# Patient Record
Sex: Male | Born: 1980 | Race: Black or African American | Hispanic: No | Marital: Married | Smoking: Never smoker
Health system: Southern US, Community
[De-identification: ages and names within clinical notes are randomized; demographics above are authoritative.]

## PROBLEM LIST (undated history)

## (undated) DIAGNOSIS — I10 Essential (primary) hypertension: Secondary | ICD-10-CM

---

## 2018-03-16 ENCOUNTER — Other Ambulatory Visit: Payer: Self-pay | Admitting: Gastroenterology

## 2018-03-16 DIAGNOSIS — R1033 Periumbilical pain: Secondary | ICD-10-CM

## 2018-03-17 ENCOUNTER — Ambulatory Visit
Admission: RE | Admit: 2018-03-17 | Discharge: 2018-03-17 | Disposition: A | Discharge status: Home / Self Care | Payer: Self-pay | Source: Ambulatory Visit | Attending: Gastroenterology | Admitting: Gastroenterology

## 2018-03-17 DIAGNOSIS — R1033 Periumbilical pain: Secondary | ICD-10-CM

## 2018-03-17 MED ORDER — IOPAMIDOL (ISOVUE-300) INJECTION 61%
100.0000 mL | Freq: Once | INTRAVENOUS | Status: AC | PRN
  Administered 2018-03-17: 100 mL via INTRAVENOUS

## 2018-07-19 ENCOUNTER — Encounter: Payer: Self-pay | Admitting: Sports Medicine

## 2018-07-19 ENCOUNTER — Ambulatory Visit (INDEPENDENT_AMBULATORY_CARE_PROVIDER_SITE_OTHER): Payer: 59 | Admitting: Sports Medicine

## 2018-07-19 ENCOUNTER — Other Ambulatory Visit: Payer: Self-pay

## 2018-07-19 VITALS — BP 120/90 | Ht 67.0 in | Wt 180.0 lb

## 2018-07-19 DIAGNOSIS — S63259A Unspecified dislocation of unspecified finger, initial encounter: Secondary | ICD-10-CM | POA: Diagnosis not present

## 2018-07-19 NOTE — Progress Notes (Addendum)
   Subjective:    Patient ID: Timothy Mcgrath, male    DOB: 1980/12/23, 38 y.o.   MRN: [id number]  HPI chief complaint: Right ring finger pain  Very pleasant right-hand-dominant 38 year old male comes in today after having injured his right ring finger yesterday around 5:00 PM.  While opening a drawer he awkwardly caught his ring finger in the drawer below.  Immediate pain and he noticed some deformity at the PIP joint.  He tried to "pop it back into place" but he was unsuccessful.  He has applied ice overnight and used a topical ointment.  Localizes his pain to the proximal phalanx and PIP joint.  Denies pain or swelling in his hand.  Denies any previous hand or wrist surgeries.  Past medical history reviewed Medications reviewed Allergies reviewed   Review of Systems As above    Objective:   Physical Exam  Well-developed, well-nourished.  No acute distress.  Awake alert and oriented x3.  Vital signs reviewed  Right hand: There is diffuse swelling throughout the entire right ring finger.  Swelling extends up into the dorsum of the right hand.  Although swelling makes obvious gross deformity hard to visualize, palpation does suggest dislocation at the PIP joint.  There is no malrotation.  Patient is unable to move the PIP joint.  Flexor and extensor tendons appear to be intact.  Brisk capillary refill distally.  Bedside ultrasound shows no obvious fracture but there does appear to be a dorsal PIP dislocation of the right ring finger.      Assessment & Plan:   Right ring finger pain and swelling with ultrasound evidence of probable dorsal PIP dislocation  Since this injury is nearly 24 hours old, it may need to be surgically reduced.  A call was placed to Dr. Cline Cools office and they have kindly agreed to see the patient in the office today.  Patient was instructed to proceed directly to their office.  Further work-up and treatment will be per the discretion of Dr. Merlyn Lot and the patient  will follow-up with me as needed.   Addendum: Please note that the injury is actually to this patient's right middle finger and not the ring finger as stated in the above note.  Addendum: Please note that the injury is actually to this patient's middle finger, not ring finger as stated in the above note.

## 2018-07-19 NOTE — Patient Instructions (Addendum)
Dr Cindee Salt [address] [place], [place] [address] Phone: [phone]

## 2020-01-14 ENCOUNTER — Ambulatory Visit
Admission: RE | Admit: 2020-01-14 | Discharge: 2020-01-14 | Disposition: A | Discharge status: Home / Self Care | Payer: Self-pay | Source: Ambulatory Visit | Attending: Family Medicine | Admitting: Family Medicine

## 2020-01-14 ENCOUNTER — Other Ambulatory Visit: Payer: Self-pay | Admitting: Family Medicine

## 2020-01-14 ENCOUNTER — Other Ambulatory Visit: Payer: Self-pay

## 2020-01-14 DIAGNOSIS — R079 Chest pain, unspecified: Secondary | ICD-10-CM

## 2020-02-08 ENCOUNTER — Other Ambulatory Visit: Payer: Self-pay | Admitting: Family Medicine

## 2020-02-08 ENCOUNTER — Ambulatory Visit
Admission: RE | Admit: 2020-02-08 | Discharge: 2020-02-08 | Disposition: A | Discharge status: Home / Self Care | Payer: No Typology Code available for payment source | Source: Ambulatory Visit | Attending: Family Medicine | Admitting: Family Medicine

## 2020-02-08 DIAGNOSIS — J189 Pneumonia, unspecified organism: Secondary | ICD-10-CM

## 2021-05-06 ENCOUNTER — Other Ambulatory Visit: Payer: Self-pay | Admitting: Family Medicine

## 2021-05-06 ENCOUNTER — Ambulatory Visit
Admission: RE | Admit: 2021-05-06 | Discharge: 2021-05-06 | Disposition: A | Discharge status: Home / Self Care | Payer: Self-pay | Source: Ambulatory Visit | Attending: Family Medicine | Admitting: Family Medicine

## 2021-05-06 ENCOUNTER — Other Ambulatory Visit: Payer: Self-pay

## 2021-05-06 DIAGNOSIS — R0600 Dyspnea, unspecified: Secondary | ICD-10-CM

## 2021-11-17 ENCOUNTER — Other Ambulatory Visit (HOSPITAL_COMMUNITY): Payer: Self-pay | Admitting: Family Medicine

## 2021-11-17 ENCOUNTER — Other Ambulatory Visit (HOSPITAL_BASED_OUTPATIENT_CLINIC_OR_DEPARTMENT_OTHER): Payer: Self-pay | Admitting: Family Medicine

## 2021-11-17 DIAGNOSIS — E785 Hyperlipidemia, unspecified: Secondary | ICD-10-CM

## 2021-12-03 ENCOUNTER — Ambulatory Visit (HOSPITAL_BASED_OUTPATIENT_CLINIC_OR_DEPARTMENT_OTHER)
Admission: RE | Admit: 2021-12-03 | Discharge: 2021-12-03 | Disposition: A | Discharge status: Home / Self Care | Payer: 59 | Source: Ambulatory Visit | Attending: Family Medicine | Admitting: Family Medicine

## 2021-12-03 DIAGNOSIS — E785 Hyperlipidemia, unspecified: Secondary | ICD-10-CM | POA: Insufficient documentation

## 2022-10-13 ENCOUNTER — Ambulatory Visit
Admission: RE | Admit: 2022-10-13 | Discharge: 2022-10-13 | Disposition: A | Discharge status: Home / Self Care | Payer: 59 | Source: Ambulatory Visit | Attending: Family Medicine | Admitting: Family Medicine

## 2022-10-13 ENCOUNTER — Other Ambulatory Visit: Payer: Self-pay | Admitting: Family Medicine

## 2022-10-13 DIAGNOSIS — R0781 Pleurodynia: Secondary | ICD-10-CM

## 2022-10-14 ENCOUNTER — Ambulatory Visit (HOSPITAL_BASED_OUTPATIENT_CLINIC_OR_DEPARTMENT_OTHER): Admission: RE | Admit: 2022-10-14 | Payer: 59 | Source: Ambulatory Visit

## 2022-10-14 ENCOUNTER — Other Ambulatory Visit (HOSPITAL_COMMUNITY): Payer: Self-pay | Admitting: Family Medicine

## 2022-10-14 DIAGNOSIS — J984 Other disorders of lung: Secondary | ICD-10-CM

## 2022-10-15 ENCOUNTER — Encounter (HOSPITAL_BASED_OUTPATIENT_CLINIC_OR_DEPARTMENT_OTHER): Payer: Self-pay

## 2022-10-16 ENCOUNTER — Ambulatory Visit (HOSPITAL_BASED_OUTPATIENT_CLINIC_OR_DEPARTMENT_OTHER)
Admission: RE | Admit: 2022-10-16 | Discharge: 2022-10-16 | Disposition: A | Discharge status: Home / Self Care | Payer: 59 | Source: Ambulatory Visit | Attending: Family Medicine | Admitting: Family Medicine

## 2022-10-16 ENCOUNTER — Encounter (HOSPITAL_BASED_OUTPATIENT_CLINIC_OR_DEPARTMENT_OTHER): Payer: Self-pay

## 2022-10-16 DIAGNOSIS — J984 Other disorders of lung: Secondary | ICD-10-CM | POA: Insufficient documentation

## 2022-10-16 MED ORDER — IOHEXOL 300 MG/ML  SOLN
80.0000 mL | Freq: Once | INTRAMUSCULAR | Status: AC | PRN
  Administered 2022-10-16: 80 mL via INTRAVENOUS

## 2022-10-18 ENCOUNTER — Other Ambulatory Visit (HOSPITAL_COMMUNITY): Payer: Self-pay | Admitting: Family Medicine

## 2022-10-18 DIAGNOSIS — J9859 Other diseases of mediastinum, not elsewhere classified: Secondary | ICD-10-CM

## 2022-10-18 DIAGNOSIS — R918 Other nonspecific abnormal finding of lung field: Secondary | ICD-10-CM

## 2022-10-22 ENCOUNTER — Encounter: Payer: Self-pay | Admitting: Family Medicine

## 2022-10-22 ENCOUNTER — Other Ambulatory Visit (HOSPITAL_COMMUNITY): Payer: Self-pay | Admitting: Family Medicine

## 2022-10-22 ENCOUNTER — Ambulatory Visit (HOSPITAL_COMMUNITY)
Admission: RE | Admit: 2022-10-22 | Discharge: 2022-10-22 | Disposition: A | Discharge status: Home / Self Care | Payer: 59 | Source: Ambulatory Visit | Attending: Family Medicine | Admitting: Family Medicine

## 2022-10-22 DIAGNOSIS — R918 Other nonspecific abnormal finding of lung field: Secondary | ICD-10-CM | POA: Diagnosis not present

## 2022-10-22 DIAGNOSIS — E041 Nontoxic single thyroid nodule: Secondary | ICD-10-CM

## 2022-10-22 DIAGNOSIS — J9859 Other diseases of mediastinum, not elsewhere classified: Secondary | ICD-10-CM | POA: Insufficient documentation

## 2022-10-22 LAB — GLUCOSE, CAPILLARY: Glucose-Capillary: 124 mg/dL — ABNORMAL HIGH (ref 70–99)

## 2022-10-22 MED ORDER — FLUDEOXYGLUCOSE F - 18 (FDG) INJECTION
9.0000 | Freq: Once | INTRAVENOUS | Status: AC
  Administered 2022-10-22: 8.44 via INTRAVENOUS

## 2022-10-24 ENCOUNTER — Ambulatory Visit (HOSPITAL_BASED_OUTPATIENT_CLINIC_OR_DEPARTMENT_OTHER)
Admission: RE | Admit: 2022-10-24 | Discharge: 2022-10-24 | Disposition: A | Discharge status: Home / Self Care | Payer: 59 | Source: Ambulatory Visit | Attending: Family Medicine | Admitting: Family Medicine

## 2022-10-24 DIAGNOSIS — E041 Nontoxic single thyroid nodule: Secondary | ICD-10-CM | POA: Diagnosis present

## 2022-10-26 ENCOUNTER — Other Ambulatory Visit (HOSPITAL_COMMUNITY): Payer: Self-pay | Admitting: Family Medicine

## 2022-10-26 DIAGNOSIS — E041 Nontoxic single thyroid nodule: Secondary | ICD-10-CM

## 2022-11-01 ENCOUNTER — Encounter: Payer: Self-pay | Admitting: Pulmonary Disease

## 2022-11-01 ENCOUNTER — Ambulatory Visit (INDEPENDENT_AMBULATORY_CARE_PROVIDER_SITE_OTHER): Payer: 59 | Admitting: Pulmonary Disease

## 2022-11-01 VITALS — BP 130/90 | HR 87 | Ht 66.0 in | Wt 173.6 lb

## 2022-11-01 DIAGNOSIS — R918 Other nonspecific abnormal finding of lung field: Secondary | ICD-10-CM | POA: Diagnosis not present

## 2022-11-01 DIAGNOSIS — R942 Abnormal results of pulmonary function studies: Secondary | ICD-10-CM | POA: Diagnosis not present

## 2022-11-01 DIAGNOSIS — J90 Pleural effusion, not elsewhere classified: Secondary | ICD-10-CM | POA: Insufficient documentation

## 2022-11-01 NOTE — Patient Instructions (Signed)
Thank you for visiting Dr. Tonia Brooms at [facility]. Today we recommend the following:  Return if symptoms worsen or fail to improve, for wednesday 11th for [facility] .    Please do your part to reduce the spread of COVID-19.

## 2022-11-01 NOTE — Progress Notes (Signed)
Synopsis: Referred in September 2024 for lung mass by Farris Has, MD  Subjective:   PATIENT ID: Timothy Mcgrath GENDER: male DOB: 1980/04/23, MRN: [id number]  Chief Complaint  Patient presents with   Consult    Consult for lung nodule/mass    This is a 42 year old gentleman, no significant past medical history.Patient was referred after having abnormal CT imaging of the chest in August 2024 as well as abnormal nuclear medicine pet imaging.  Patient had a PET scan that was completed on October 22, 2022 which reveals a 3.0 x 2.7 cm left lower lobe mass it is hypermetabolic concerning for primary bronchogenic carcinoma.  Also there was a tiny focus of uptake within the patient's left hilum.  No other evidence of metastatic disease.  He has a 13 mm left thyroid nodule as well.  Patient had a CT scan of the chest to with ongoing fever and cough.  He was diagnosed with pneumonia treated with antibiotics.  He is feeling better.  Follow-up nuclear medicine pet imaging demonstrated a worsening left-sided pleural effusion and the mass in the chest is change not significantly changed.    No past medical history on file.   No family history on file.   No past surgical history on file.  Social History   Socioeconomic History   Marital status: Married    Spouse name: Not on file   Number of children: Not on file   Years of education: Not on file   Highest education level: Not on file  Occupational History   Not on file  Tobacco Use   Smoking status: Never   Smokeless tobacco: Never  Substance and Sexual Activity   Alcohol use: Not on file   Drug use: Not on file   Sexual activity: Not on file  Other Topics Concern   Not on file  Social History Narrative   Not on file   Social Determinants of Health   Financial Resource Strain: Not on file  Food Insecurity: Not on file  Transportation Needs: Not on file  Physical Activity: Not on file  Stress: Not on file  Social Connections:  Not on file  Intimate Partner Violence: Not on file     Allergies  Allergen Reactions   Iodinated Contrast Media Nausea And Vomiting     Outpatient Medications Prior to Visit  Medication Sig Dispense Refill   amLODipine (NORVASC) 10 MG tablet Take 10 mg by mouth daily.     lisinopril (ZESTRIL) 20 MG tablet Take 20 mg by mouth daily.     metoprolol succinate (TOPROL-XL) 25 MG 24 hr tablet Take 25 mg by mouth daily.     KAPSPARGO SPRINKLE 25 MG CS24 Take 1 tablet by mouth daily.     No facility-administered medications prior to visit.    Review of Systems  Constitutional:  Negative for chills, fever, malaise/fatigue and weight loss.  HENT:  Negative for hearing loss, sore throat and tinnitus.   Eyes:  Negative for blurred vision and double vision.  Respiratory:  Positive for cough and shortness of breath. Negative for hemoptysis, sputum production, wheezing and stridor.        Inspiratory pleuritic chest pains  Cardiovascular:  Negative for chest pain, palpitations, orthopnea, leg swelling and PND.  Gastrointestinal:  Negative for abdominal pain, constipation, diarrhea, heartburn, nausea and vomiting.  Genitourinary:  Negative for dysuria, hematuria and urgency.  Musculoskeletal:  Negative for joint pain and myalgias.  Skin:  Negative for itching and rash.  Neurological:  Negative for dizziness, tingling, weakness and headaches.  Endo/Heme/Allergies:  Negative for environmental allergies. Does not bruise/bleed easily.  Psychiatric/Behavioral:  Negative for depression. The patient is not nervous/anxious and does not have insomnia.   All other systems reviewed and are negative.    Objective:  Physical Exam Vitals reviewed.  Constitutional:      General: He is not in acute distress.    Appearance: He is well-developed.  HENT:     Head: Normocephalic and atraumatic.  Eyes:     General: No scleral icterus.    Conjunctiva/sclera: Conjunctivae normal.     Pupils: Pupils are  equal, round, and reactive to light.  Neck:     Vascular: No JVD.     Trachea: No tracheal deviation.  Cardiovascular:     Rate and Rhythm: Normal rate and regular rhythm.     Heart sounds: Normal heart sounds. No murmur heard. Pulmonary:     Effort: Pulmonary effort is normal. No tachypnea, accessory muscle usage or respiratory distress.     Breath sounds: No stridor. No wheezing, rhonchi or rales.  Abdominal:     General: There is no distension.     Palpations: Abdomen is soft.     Tenderness: There is no abdominal tenderness.  Musculoskeletal:        General: No tenderness.     Cervical back: Neck supple.  Lymphadenopathy:     Cervical: No cervical adenopathy.  Skin:    General: Skin is warm and dry.     Capillary Refill: Capillary refill takes less than 2 seconds.     Findings: No rash.  Neurological:     Mental Status: He is alert and oriented to person, place, and time.  Psychiatric:        Behavior: Behavior normal.      Vitals:   11/01/22 1512  BP: (!) 130/90  Pulse: 87  SpO2: 97%  Weight: 173 lb 9.6 oz (78.7 kg)  Height: 5\' 6"  (1.676 m)   97% on RA BMI Readings from Last 3 Encounters:  11/01/22 28.02 kg/m  10/22/22 26.63 kg/m  07/19/18 28.19 kg/m   Wt Readings from Last 3 Encounters:  11/01/22 173 lb 9.6 oz (78.7 kg)  10/22/22 170 lb (77.1 kg)  07/19/18 180 lb (81.6 kg)     CBC No results found for: "WBC", "RBC", "HGB", "HCT", "PLT", "MCV", "MCH", "MCHC", "RDW", "LYMPHSABS", "MONOABS", "EOSABS", "BASOSABS"    Chest Imaging: Nuclear medicine pet imaging August 2024: Left lower lobe effusion progressed since last CT, hypermetabolic lesion. To me appears like this is pneumonia with a progressive effusion. The patient's images have been independently reviewed by me.    Pulmonary Functions Testing Results:     No data to display          FeNO:   Pathology:   Echocardiogram:   Heart Catheterization:     Assessment & Plan:      ICD-10-CM   1. Mass of lower lobe of left lung  R91.8 Procedural/ Surgical Case Request: INSERTION PLEURAL DRAINAGE CATHETER    Ambulatory referral to Pulmonology    2. Abnormal PET of left lung  R94.2 Procedural/ Surgical Case Request: INSERTION PLEURAL DRAINAGE CATHETER    Ambulatory referral to Pulmonology    3. Pleural effusion  J90 Procedural/ Surgical Case Request: INSERTION PLEURAL DRAINAGE CATHETER    Ambulatory referral to Pulmonology      Discussion:  This is a 42 year old gentleman now with a new effusion in the  left.  Ultrasound today in the office to confirm this.  He presented initially with fevers and cough.  I suspect what were dealing with his pneumonia in this gentleman setting.  He needs to have this drained.  We talked about thoracentesis versus pigtail catheter placement.  There is a little bit of fibrin deposition in his ultrasound today and I think is probably best that he has complete drainage with a pleural catheter.  We talked about having this done today and he is not willing to have this done yet.  Plan: Recommend pigtail catheter placement Tentative date he states he can do Wednesday at that afternoon. Working him set up with one of my partners at [facility] endoscopy. I suspect the mass that he has in his chest is actually pneumonia but we need to follow this closely and also sent for cytology on the pleural fluid.  If lesion stays persistent of course would potentially need bronchoscopy and biopsy in the future.     Current Outpatient Medications:    amLODipine (NORVASC) 10 MG tablet, Take 10 mg by mouth daily., Disp: , Rfl:    lisinopril (ZESTRIL) 20 MG tablet, Take 20 mg by mouth daily., Disp: , Rfl:    metoprolol succinate (TOPROL-XL) 25 MG 24 hr tablet, Take 25 mg by mouth daily., Disp: , Rfl:    Josephine Igo, DO [facility] 11/01/2022 3:43 PM

## 2022-11-02 ENCOUNTER — Encounter (HOSPITAL_COMMUNITY): Payer: Self-pay | Admitting: Certified Registered Nurse Anesthetist

## 2022-11-03 ENCOUNTER — Inpatient Hospital Stay (HOSPITAL_COMMUNITY)
Admission: RE | Admit: 2022-11-03 | Discharge: 2022-11-07 | DRG: 187 | Disposition: A | Discharge status: Home / Self Care | Payer: 59 | Attending: Pulmonary Disease | Admitting: Pulmonary Disease

## 2022-11-03 ENCOUNTER — Inpatient Hospital Stay (HOSPITAL_COMMUNITY): Payer: 59

## 2022-11-03 ENCOUNTER — Encounter (HOSPITAL_COMMUNITY): Payer: Self-pay | Admitting: Pulmonary Disease

## 2022-11-03 ENCOUNTER — Encounter (HOSPITAL_COMMUNITY)
Admission: RE | Disposition: A | Discharge status: Home / Self Care | Payer: Self-pay | Source: Home / Self Care | Attending: Pulmonary Disease

## 2022-11-03 ENCOUNTER — Other Ambulatory Visit: Payer: Self-pay

## 2022-11-03 DIAGNOSIS — I10 Essential (primary) hypertension: Secondary | ICD-10-CM | POA: Diagnosis present

## 2022-11-03 DIAGNOSIS — E222 Syndrome of inappropriate secretion of antidiuretic hormone: Secondary | ICD-10-CM | POA: Diagnosis present

## 2022-11-03 DIAGNOSIS — J939 Pneumothorax, unspecified: Secondary | ICD-10-CM | POA: Diagnosis not present

## 2022-11-03 DIAGNOSIS — Z91041 Radiographic dye allergy status: Secondary | ICD-10-CM | POA: Diagnosis not present

## 2022-11-03 DIAGNOSIS — R918 Other nonspecific abnormal finding of lung field: Secondary | ICD-10-CM | POA: Diagnosis present

## 2022-11-03 DIAGNOSIS — J90 Pleural effusion, not elsewhere classified: Principal | ICD-10-CM | POA: Diagnosis present

## 2022-11-03 DIAGNOSIS — Z79899 Other long term (current) drug therapy: Secondary | ICD-10-CM | POA: Diagnosis not present

## 2022-11-03 DIAGNOSIS — R942 Abnormal results of pulmonary function studies: Secondary | ICD-10-CM | POA: Diagnosis present

## 2022-11-03 DIAGNOSIS — J302 Other seasonal allergic rhinitis: Secondary | ICD-10-CM | POA: Diagnosis present

## 2022-11-03 HISTORY — DX: Essential (primary) hypertension: I10

## 2022-11-03 HISTORY — PX: CHEST TUBE INSERTION: SHX231

## 2022-11-03 LAB — BASIC METABOLIC PANEL
Anion gap: 8 (ref 5–15)
BUN: 19 mg/dL (ref 6–20)
CO2: 28 mmol/L (ref 22–32)
Calcium: 8.8 mg/dL — ABNORMAL LOW (ref 8.9–10.3)
Chloride: 95 mmol/L — ABNORMAL LOW (ref 98–111)
Creatinine, Ser: 0.76 mg/dL (ref 0.61–1.24)
GFR, Estimated: >60 mL/min (ref >60)
Glucose, Bld: 96 mg/dL (ref 70–99)
Potassium: 3.2 mmol/L — ABNORMAL LOW (ref 3.5–5.1)
Sodium: 131 mmol/L — ABNORMAL LOW (ref 135–145)

## 2022-11-03 LAB — PROTEIN, PLEURAL OR PERITONEAL FLUID: Total protein, fluid: 4.8 g/dL

## 2022-11-03 LAB — BODY FLUID CELL COUNT WITH DIFFERENTIAL
Eos, Fluid: 2 %
Lymphs, Fluid: 80 %
Monocyte-Macrophage-Serous Fluid: 10 % — ABNORMAL LOW (ref 50–90)
Neutrophil Count, Fluid: 8 % (ref 0–25)
Total Nucleated Cell Count, Fluid: 4578 uL — ABNORMAL HIGH (ref 0–1000)

## 2022-11-03 LAB — CBC
HCT: 41.6 % (ref 39.0–52.0)
Hemoglobin: 14.4 g/dL (ref 13.0–17.0)
MCH: 29.8 pg (ref 26.0–34.0)
MCHC: 34.6 g/dL (ref 30.0–36.0)
MCV: 86 fL (ref 80.0–100.0)
Platelets: 385 10*3/uL (ref 150–400)
RBC: 4.84 MIL/uL (ref 4.22–5.81)
RDW: 11.5 % (ref 11.5–15.5)
WBC: 6.6 10*3/uL (ref 4.0–10.5)
nRBC: 0 % (ref 0.0–0.2)

## 2022-11-03 LAB — LACTATE DEHYDROGENASE, PLEURAL OR PERITONEAL FLUID: LD, Fluid: 168 U/L — ABNORMAL HIGH (ref 3–23)

## 2022-11-03 LAB — GLUCOSE, PLEURAL OR PERITONEAL FLUID: Glucose, Fluid: 112 mg/dL

## 2022-11-03 LAB — LACTATE DEHYDROGENASE: LDH: 146 U/L (ref 98–192)

## 2022-11-03 LAB — PROTEIN, TOTAL: Total Protein: 7.6 g/dL (ref 6.5–8.1)

## 2022-11-03 SURGERY — CHEST TUBE INSERTION
Anesthesia: LOCAL | Laterality: Left

## 2022-11-03 MED ORDER — OXYCODONE-ACETAMINOPHEN 5-325 MG PO TABS
1.0000 | ORAL_TABLET | ORAL | Status: DC | PRN
  Administered 2022-11-03 – 2022-11-05 (×7): 2 via ORAL
  Administered 2022-11-05: 1 via ORAL
  Administered 2022-11-06 – 2022-11-07 (×3): 2 via ORAL
  Filled 2022-11-03 (×6): qty 2
  Filled 2022-11-03: qty 1
  Filled 2022-11-03 (×4): qty 2

## 2022-11-03 MED ORDER — TEMAZEPAM 7.5 MG PO CAPS
7.5000 mg | ORAL_CAPSULE | Freq: Every evening | ORAL | Status: DC | PRN
  Administered 2022-11-03 – 2022-11-04 (×2): 7.5 mg via ORAL
  Filled 2022-11-03 (×3): qty 1

## 2022-11-03 MED ORDER — ACETAMINOPHEN 325 MG PO TABS: 650.0000 mg | ORAL_TABLET | ORAL | Status: DC | PRN

## 2022-11-03 MED ORDER — KETOROLAC TROMETHAMINE 30 MG/ML IJ SOLN
30.0000 mg | Freq: Once | INTRAMUSCULAR | Status: AC
  Administered 2022-11-03: 30 mg via INTRAVENOUS
  Filled 2022-11-03: qty 1

## 2022-11-03 MED ORDER — MIDAZOLAM HCL (PF) 5 MG/ML IJ SOLN
INTRAMUSCULAR | Status: AC
  Filled 2022-11-03: qty 1

## 2022-11-03 MED ORDER — SODIUM CHLORIDE 0.9% FLUSH
3.0000 mL | Freq: Two times a day (BID) | INTRAVENOUS | Status: DC
  Administered 2022-11-03 – 2022-11-06 (×7): 3 mL via INTRAVENOUS

## 2022-11-03 MED ORDER — MIDAZOLAM HCL (PF) 5 MG/ML IJ SOLN
2.0000 mg | Freq: Once | INTRAMUSCULAR | Status: AC
  Administered 2022-11-03: 2 mg via INTRAVENOUS

## 2022-11-03 MED ORDER — AMLODIPINE BESYLATE 10 MG PO TABS
10.0000 mg | ORAL_TABLET | Freq: Every day | ORAL | Status: DC
  Administered 2022-11-04: 10 mg via ORAL
  Filled 2022-11-03: qty 1

## 2022-11-03 MED ORDER — KETOROLAC TROMETHAMINE 15 MG/ML IJ SOLN
15.0000 mg | Freq: Four times a day (QID) | INTRAMUSCULAR | Status: DC
  Administered 2022-11-03 – 2022-11-07 (×16): 15 mg via INTRAVENOUS
  Filled 2022-11-03 (×16): qty 1

## 2022-11-03 MED ORDER — METOPROLOL SUCCINATE ER 25 MG PO TB24
25.0000 mg | ORAL_TABLET | Freq: Every day | ORAL | Status: DC
  Administered 2022-11-04 – 2022-11-07 (×4): 25 mg via ORAL
  Filled 2022-11-03 (×4): qty 1

## 2022-11-03 MED ORDER — LISINOPRIL 20 MG PO TABS
20.0000 mg | ORAL_TABLET | Freq: Every day | ORAL | Status: DC
  Administered 2022-11-04 – 2022-11-07 (×4): 20 mg via ORAL
  Filled 2022-11-03 (×4): qty 1

## 2022-11-03 MED ORDER — SODIUM CHLORIDE 0.9 % IV SOLN: 250.0000 mL | INTRAVENOUS | Status: DC | PRN

## 2022-11-03 MED ORDER — OXYCODONE-ACETAMINOPHEN 5-325 MG PO TABS
1.0000 | ORAL_TABLET | ORAL | Status: DC | PRN
  Administered 2022-11-03 (×2): 1 via ORAL
  Filled 2022-11-03 (×2): qty 1

## 2022-11-03 MED ORDER — DOCUSATE SODIUM 100 MG PO CAPS: 100.0000 mg | ORAL_CAPSULE | Freq: Two times a day (BID) | ORAL | Status: DC | PRN

## 2022-11-03 MED ORDER — SODIUM CHLORIDE 0.9% FLUSH: 3.0000 mL | INTRAVENOUS | Status: DC | PRN

## 2022-11-03 MED ORDER — SODIUM CHLORIDE 0.9% FLUSH
10.0000 mL | Freq: Three times a day (TID) | INTRAVENOUS | Status: DC
  Administered 2022-11-03 – 2022-11-07 (×10): 10 mL via INTRAPLEURAL

## 2022-11-03 MED ORDER — CALCIUM CARBONATE ANTACID 500 MG PO CHEW: 1.0000 | CHEWABLE_TABLET | Freq: Three times a day (TID) | ORAL | Status: AC | PRN

## 2022-11-03 MED ORDER — POLYETHYLENE GLYCOL 3350 17 G PO PACK: 17.0000 g | PACK | Freq: Every day | ORAL | Status: DC | PRN

## 2022-11-03 MED ORDER — ENOXAPARIN SODIUM 40 MG/0.4ML IJ SOSY
40.0000 mg | PREFILLED_SYRINGE | INTRAMUSCULAR | Status: DC
  Administered 2022-11-03 – 2022-11-06 (×4): 40 mg via SUBCUTANEOUS
  Filled 2022-11-03 (×4): qty 0.4

## 2022-11-03 NOTE — H&P (Signed)
NAME:  Timothy Mcgrath, MRN:  [id number], DOB:  19-Jul-1980, LOS: 0 ADMISSION DATE:  11/03/2022, CONSULTATION DATE:  9/11 REFERRING MD:  Icard, CHIEF COMPLAINT:  left Pleural effusion   History of Present Illness:  42 year old male patient referred from our pulmonary office for left pleural effusion.  Treated back in August for initially what was felt to be a pneumonia, presented with fever chills cough with pleuritic type chest pain felt better following completion of antibiotics, a follow-up CT chest was ordered and completed raising concern for possible lung mass.  He was therefore sent for a PET scan on August 30 this revealed a 3 x 2.7 cm left lower lobe mass.  It was hypermetabolic and therefore he was referred to pulmonary for evaluation.  On the time of imaging he also had a moderate-sized left pleural effusion.  He has been referred for hospital admission for left thoracostomy tube placement and evaluation and drainage of pleural fluid  Pertinent  Medical History  Hypertension and seasonal allergies   Significant Hospital Events: Including procedures, antibiotic start and stop dates in addition to other pertinent events   9/11 admitted, left thoracostomy tube placed  Interim History / Subjective:  Resting comfortably   Objective   Blood pressure (!) 142/95, pulse 98, temperature 99.3 F (37.4 C), temperature source Temporal, resp. rate 19, SpO2 96%.       No intake or output data in the 24 hours ending 11/03/22 1022 There were no vitals filed for this visit.  Examination: General: 42 year old male patient currently resting in stretcher no acute distress HENT: Normocephalic atraumatic no jugular venous distention appreciated Lungs: Clear, little diminished in the left base.  Now has a left lateral 14 French chest tube in place there is good tidal, no airleak, he has drained approximately 950 mL thus far Cardiovascular: Regular rate and rhythm without murmur rub or gallop Abdomen:  Soft nontender no organomegaly Extremities: Warm dry with brisk capillary refill Neuro: Awake and oriented no focal deficits GU: Due to void  Resolved Hospital Problem list     Assessment & Plan:  Left pleural effusion.  Now status post left thoracostomy tube. -Looks grossly exudative, most likely parapneumonic process, but need to rule out empyema or malignancy Plan Continue chest tube drainage to waterseal for now, had a fair amount of discomfort initially after reaching approximately 900 mL output, this discomfort resolved after removing suction Follow-up portable chest x-ray Follow-up pleural studies, have sent LDH protein cytology cell count and culture As needed analgesia, as well as scheduled Toradol for now A.m. chest x-ray Admit to progressive care  History of hypertension Plan Continue his Norvasc and ACE inhibitor  History of possible lung mass versus pneumonia Plan Follow-up chest x-ray imaging +  Best Practice (right click and "Reselect all SmartList Selections" daily)   Diet/type: Regular consistency (see orders) DVT prophylaxis: prophylactic heparin  GI prophylaxis: N/A Lines: N/A Foley:  N/A Code Status:  full code Last date of multidisciplinary goals of care discussion [full code]  Labs   CBC: No results for input(s): "WBC", "NEUTROABS", "HGB", "HCT", "MCV", "PLT" in the last 168 hours.  Basic Metabolic Panel: No results for input(s): "NA", "K", "CL", "CO2", "GLUCOSE", "BUN", "CREATININE", "CALCIUM", "MG", "PHOS" in the last 168 hours. GFR: CrCl cannot be calculated (No successful lab value found.). No results for input(s): "PROCALCITON", "WBC", "LATICACIDVEN" in the last 168 hours.  Liver Function Tests: No results for input(s): "AST", "ALT", "ALKPHOS", "BILITOT", "PROT", "ALBUMIN" in the last 168  hours. No results for input(s): "LIPASE", "AMYLASE" in the last 168 hours. No results for input(s): "AMMONIA" in the last 168 hours.  ABG No results  found for: "PHART", "PCO2ART", "PO2ART", "HCO3", "TCO2", "ACIDBASEDEF", "O2SAT"   Coagulation Profile: No results for input(s): "INR", "PROTIME" in the last 168 hours.  Cardiac Enzymes: No results for input(s): "CKTOTAL", "CKMB", "CKMBINDEX", "TROPONINI" in the last 168 hours.  HbA1C: No results found for: "HGBA1C"  CBG: No results for input(s): "GLUCAP" in the last 168 hours.  Review of Systems:   Review of Systems  Constitutional: Negative.   HENT: Negative.    Eyes: Negative.   Respiratory: Negative.         Feels "fullness in chest"  Cardiovascular: Negative.   Gastrointestinal: Negative.   Genitourinary: Negative.   Musculoskeletal: Negative.   Skin: Negative.   Neurological: Negative.   Endo/Heme/Allergies: Negative.      Past Medical History:  He,  has a past medical history of Hypertension.   Surgical History:  History reviewed. No pertinent surgical history.   Social History:   reports that he has never smoked. He has never used smokeless tobacco.   Family History:  His family history is not on file.   Allergies Allergies  Allergen Reactions   Iodinated Contrast Media Nausea And Vomiting     Home Medications  Prior to Admission medications   Medication Sig Start Date End Date Taking? Authorizing Provider  amLODipine (NORVASC) 10 MG tablet Take 10 mg by mouth daily. 04/14/18  Yes [provider]  diphenhydrAMINE (BENADRYL) 25 mg capsule Take 25 mg by mouth every 6 (six) hours as needed for allergies.   Yes [provider]  lisinopril (ZESTRIL) 20 MG tablet Take 20 mg by mouth daily. 05/16/18  Yes [provider]  metoprolol succinate (TOPROL-XL) 25 MG 24 hr tablet Take 25 mg by mouth daily. 09/11/22  Yes [provider]     Critical care time: NA     Simonne Martinet ACNP-BC [facility]/Critical Care Pager # [phone] OR # [phone] if no answer

## 2022-11-03 NOTE — Procedures (Signed)
Insertion of Chest Tube Procedure Note  Timothy Mcgrath  [id number]  17-May-1980  Date:11/03/22  Time:10:23 AM    Provider Performing: Cristopher Peru   Procedure: Pleural Catheter Insertion w/o Imaging Guidance ([id number])  Indication(s) Effusion  Consent Risks of the procedure as well as the alternatives and risks of each were explained to the patient and/or caregiver.  Consent for the procedure was obtained and is signed in the bedside chart  Anesthesia Topical only with 1% lidocaine    Time Out Verified patient identification, verified procedure, site/side was marked, verified correct patient position, special equipment/implants available, medications/allergies/relevant history reviewed, required imaging and test results available.   Sterile Technique Maximal sterile technique including full sterile barrier drape, hand hygiene, sterile gown, sterile gloves, mask, hair covering, sterile ultrasound probe cover (if used).   Procedure Description Ultrasound used to identify appropriate pleural anatomy for placement and overlying skin marked. Area of placement cleaned and draped in sterile fashion.  A 14 French pigtail pleural catheter was placed into the left pleural space using Seldinger technique. Appropriate return of fluid was obtained.  The tube was connected to atrium and placed on -20 cm H2O wall suction.   Complications/Tolerance None; patient tolerated the procedure well. Chest X-ray is ordered to verify placement.   EBL Minimal  Specimen(s) fluid

## 2022-11-04 ENCOUNTER — Inpatient Hospital Stay (HOSPITAL_COMMUNITY): Payer: 59

## 2022-11-04 ENCOUNTER — Telehealth: Payer: Self-pay | Admitting: Student in an Organized Health Care Education/Training Program

## 2022-11-04 DIAGNOSIS — J90 Pleural effusion, not elsewhere classified: Secondary | ICD-10-CM | POA: Diagnosis not present

## 2022-11-04 LAB — BASIC METABOLIC PANEL
Anion gap: 9 (ref 5–15)
BUN: 22 mg/dL — ABNORMAL HIGH (ref 6–20)
CO2: 23 mmol/L (ref 22–32)
Calcium: 8.9 mg/dL (ref 8.9–10.3)
Chloride: 98 mmol/L (ref 98–111)
Creatinine, Ser: 0.9 mg/dL (ref 0.61–1.24)
GFR, Estimated: >60 mL/min (ref >60)
Glucose, Bld: 101 mg/dL — ABNORMAL HIGH (ref 70–99)
Potassium: 3.4 mmol/L — ABNORMAL LOW (ref 3.5–5.1)
Sodium: 130 mmol/L — ABNORMAL LOW (ref 135–145)

## 2022-11-04 LAB — HIV ANTIBODY (ROUTINE TESTING W REFLEX): HIV Screen 4th Generation wRfx: NONREACTIVE

## 2022-11-04 MED ORDER — BISACODYL 5 MG PO TBEC
10.0000 mg | DELAYED_RELEASE_TABLET | Freq: Every day | ORAL | Status: AC
  Administered 2022-11-04 – 2022-11-05 (×2): 10 mg via ORAL
  Filled 2022-11-04 (×2): qty 2

## 2022-11-04 MED ORDER — BISACODYL 5 MG PO TBEC: 10.0000 mg | DELAYED_RELEASE_TABLET | Freq: Every day | ORAL | Status: DC | PRN

## 2022-11-04 MED ORDER — ORAL CARE MOUTH RINSE: 15.0000 mL | OROMUCOSAL | Status: DC | PRN

## 2022-11-04 NOTE — Progress Notes (Signed)
   NAME:  Timothy Mcgrath, MRN:  [id number], DOB:  1980-11-16, LOS: 1 ADMISSION DATE:  11/03/2022, CONSULTATION DATE: 11/03/2022 REFERRING MD: Dr. Tonia Brooms, CHIEF COMPLAINT: Left pleural effusion  History of Present Illness:  42 year old male patient referred from our pulmonary office for left pleural effusion. Treated back in August for initially what was felt to be a pneumonia, presented with fever chills cough with pleuritic type chest pain felt better following completion of antibiotics, a follow-up CT chest was ordered and completed raising concern for possible lung mass. He was therefore sent for a PET scan on August 30 this revealed a 3 x 2.7 cm left lower lobe mass. It was hypermetabolic and therefore he was referred to pulmonary for evaluation. On the time of imaging he also had a moderate-sized left pleural effusion. He has been referred for hospital admission for left thoracostomy tube placement and evaluation and drainage of pleural fluid   Pertinent  Medical History  Hypertension, seasonal allergies  Significant Hospital Events: Including procedures, antibiotic start and stop dates in addition to other pertinent events   9/11 admitted, chest tube placed  Interim History / Subjective:  No overnight events Some constipation  Objective   Blood pressure (!) 142/98, pulse 77, temperature 98.5 F (36.9 C), temperature source Oral, resp. rate 20, height 5\' 6"  (1.676 m), weight 79.5 kg, SpO2 98%.        Intake/Output Summary (Last 24 hours) at 11/04/2022 1211 Last data filed at 11/04/2022 0600 Gross per 24 hour  Intake 10 ml  Output 180 ml  Net -170 ml   Filed Weights   11/03/22 1541 11/04/22 0500  Weight: 72.2 kg 79.5 kg    Examination: General: Middle-age, does not appear to be in distress HENT: Moist oral mucosa Lungs: Good air entry bilaterally Cardiovascular: S1-S2 appreciated Abdomen: Soft, bowel sounds appreciated Extremities: No clubbing, no edema Neuro: Alert and  oriented x 3 GU:   Resolved Hospital Problem list     Assessment & Plan:  Left pleural effusion -S/p chest tube placement -Fluid is exudative -Cytology is pending  Fluid output about 180 cc over the last 12 hours Did put out about 900 cc initially  Pain management  History of hypertension -Continue Norvasc and ACE inhibitor  Follow radiological studies    Best Practice (right click and "Reselect all SmartList Selections" daily)   Diet/type: Regular consistency (see orders) DVT prophylaxis: SCD GI prophylaxis: N/A Lines: N/A Foley:  N/A Code Status:  full code Last date of multidisciplinary goals of care discussion [cussed ongoing care with patient]  Labs   CBC: Recent Labs  Lab 11/03/22 1516  WBC 6.6  HGB 14.4  HCT 41.6  MCV 86.0  PLT 385    Basic Metabolic Panel: Recent Labs  Lab 11/03/22 1516 11/04/22 0411  NA 131* 130*  K 3.2* 3.4*  CL 95* 98  CO2 28 23  GLUCOSE 96 101*  BUN 19 22*  CREATININE 0.76 0.90  CALCIUM 8.8* 8.9   Lugenia Assefa Wynona Neat, MD [facility] Pager: See Loretha Stapler

## 2022-11-04 NOTE — TOC CM/SW Note (Signed)
Transition of Care [facility]) - Inpatient Brief Assessment   Patient Details  Name: Timothy Mcgrath MRN: [id number] Date of Birth: Jun 02, 1980  [facility] CM/SW Contact:    Howell Rucks, RN Phone Number: 11/04/2022, 11:28 AM   Clinical Narrative: Met with pt at bedside to introduce role of TOC/NCM and review for  dc planning. Pt confirmed he has a PCP and pharmacy in place, no current home care services or home DME, pt reports he feels safe returning home with support from his family,  pt confirmed he has  transportation available at discharge. TOC Brief Assessment completed. No TOC needs identified at this time.     Transition of Care Asessment: Insurance and Status: Insurance coverage has been reviewed Patient has primary care physician: Yes Home environment has been reviewed: resides in private residence with spouse Prior level of function:: Independent Prior/Current Home Services: No current home services Social Determinants of Health Reivew: SDOH reviewed no interventions necessary Readmission risk has been reviewed: Yes Transition of care needs: no transition of care needs at this time

## 2022-11-04 NOTE — Telephone Encounter (Signed)
Timothy Mcgrath from [facility]-chest tube placed today. Patient needs something for indigestion.

## 2022-11-04 NOTE — Plan of Care (Signed)
  Problem: Pain Managment: Goal: General experience of comfort will improve Outcome: Progressing   Problem: Safety: Goal: Ability to remain free from injury will improve Outcome: Progressing   

## 2022-11-05 ENCOUNTER — Inpatient Hospital Stay (HOSPITAL_COMMUNITY): Payer: 59

## 2022-11-05 DIAGNOSIS — J90 Pleural effusion, not elsewhere classified: Secondary | ICD-10-CM | POA: Diagnosis not present

## 2022-11-05 LAB — CYTOLOGY - NON PAP

## 2022-11-05 MED ORDER — TEMAZEPAM 15 MG PO CAPS
15.0000 mg | ORAL_CAPSULE | Freq: Every evening | ORAL | Status: DC | PRN
  Administered 2022-11-05 – 2022-11-06 (×2): 15 mg via ORAL
  Filled 2022-11-05 (×2): qty 1

## 2022-11-05 MED ORDER — AMLODIPINE BESYLATE 10 MG PO TABS
10.0000 mg | ORAL_TABLET | Freq: Every day | ORAL | Status: DC
  Administered 2022-11-05 – 2022-11-07 (×3): 10 mg via ORAL
  Filled 2022-11-05 (×3): qty 1

## 2022-11-05 NOTE — Plan of Care (Signed)

## 2022-11-05 NOTE — Plan of Care (Signed)
Problem: Safety: Goal: Ability to remain free from injury will improve Outcome: Progressing   Problem: Pain Managment: Goal: General experience of comfort will improve Outcome: Progressing   Problem: Coping: Goal: Level of anxiety will decrease Outcome: Progressing

## 2022-11-05 NOTE — Progress Notes (Signed)
NAME:  Timothy Mcgrath, MRN:  [id number], DOB:  12/10/1980, LOS: 2 ADMISSION DATE:  11/03/2022, CONSULTATION DATE: 11/03/2022 REFERRING MD: Dr. Tonia Brooms, CHIEF COMPLAINT: Left pleural effusion  History of Present Illness:  42 year old male patient referred from our pulmonary office for left pleural effusion. Treated back in August for initially what was felt to be a pneumonia, presented with fever chills cough with pleuritic type chest pain felt better following completion of antibiotics, a follow-up CT chest was ordered and completed raising concern for possible lung mass. He was therefore sent for a PET scan on August 30 this revealed a 3 x 2.7 cm left lower lobe mass. It was hypermetabolic and therefore he was referred to pulmonary for evaluation. On the time of imaging he also had a moderate-sized left pleural effusion. He has been referred for hospital admission for left thoracostomy tube placement and evaluation and drainage of pleural fluid   Pertinent  Medical History  Hypertension, seasonal allergies  Significant Hospital Events: Including procedures, antibiotic start and stop dates in addition to other pertinent events   9/11 admitted, chest tube placed  Interim History / Subjective:  No overnight events Chest tube output low, CXR demonstrates near total removal of fluid  Objective   Blood pressure (!) 125/95, pulse 79, temperature 99 F (37.2 C), temperature source Oral, resp. rate 20, height 5\' 6"  (1.676 m), weight 76.6 kg, SpO2 97%.        Intake/Output Summary (Last 24 hours) at 11/05/2022 1243 Last data filed at 11/05/2022 1140 Gross per 24 hour  Intake 493 ml  Output 140 ml  Net 353 ml   Filed Weights   11/03/22 1541 11/04/22 0500 11/05/22 0529  Weight: 72.2 kg 79.5 kg 76.6 kg    Examination: General: Middle-age, does not appear to be in distress HENT: Moist oral mucosa Lungs: Good air entry bilaterally Cardiovascular: S1-S2 appreciated Abdomen: Soft, bowel sounds  appreciated Extremities: No clubbing, no edema Neuro: Alert and oriented x 3 GU:   Resolved Hospital Problem list     Assessment & Plan:  Left pleural effusion - parapneumonic suspected: Associated with Pna vs possible malignancy. Completed course of abx prior to admission with initaial imaging 09/2022. Tiny PTX -S/p chest tube placement -Fluid is exudative -Cytology is pending -gram stain negative, NGTD on fluid culture - Additional 14 days Augmentin BID out of abundance of caution re: infection - Plan repeat CT in ~4 weeks if cytology negative - Output diminished, < 200 cc last 24 hrs and CXR shows total evacuation of effusion -Tiny apical PTX, no air leak with forced expiration on water seal, water seal trial then repeat CXR  History of hypertension: BP elevated after stopping amlodipine -Continue amlodipine (re-ordered today), metoprolol, and lisinopril   Best Practice (right click and "Reselect all SmartList Selections" daily)   Diet/type: Regular consistency (see orders) DVT prophylaxis: SCD GI prophylaxis: N/A Lines: N/A Foley:  N/A Code Status:  full code Last date of multidisciplinary goals of care discussion [discussed ongoing care with patient]  Labs   CBC: Recent Labs  Lab 11/03/22 1516  WBC 6.6  HGB 14.4  HCT 41.6  MCV 86.0  PLT 385    Basic Metabolic Panel: Recent Labs  Lab 11/03/22 1516 11/04/22 0411  NA 131* 130*  K 3.2* 3.4*  CL 95* 98  CO2 28 23  GLUCOSE 96 101*  BUN 19 22*  CREATININE 0.76 0.90  CALCIUM 8.8* 8.9   Karren Burly, MD [facility] Pager: See  Amion

## 2022-11-05 NOTE — Telephone Encounter (Signed)
This patient is an inpatient. Hospital should not be contacting office for orders. Encounter is being closed.

## 2022-11-06 ENCOUNTER — Inpatient Hospital Stay (HOSPITAL_COMMUNITY): Payer: 59

## 2022-11-06 DIAGNOSIS — J90 Pleural effusion, not elsewhere classified: Secondary | ICD-10-CM | POA: Diagnosis not present

## 2022-11-06 LAB — BODY FLUID CULTURE W GRAM STAIN
Culture: NO GROWTH
Gram Stain: NONE SEEN

## 2022-11-06 MED ORDER — AMOXICILLIN-POT CLAVULANATE 875-125 MG PO TABS
1.0000 | ORAL_TABLET | Freq: Two times a day (BID) | ORAL | Status: DC
  Administered 2022-11-06 – 2022-11-07 (×3): 1 via ORAL
  Filled 2022-11-06 (×3): qty 1

## 2022-11-06 NOTE — Plan of Care (Signed)
Problem: Safety: Goal: Ability to remain free from injury will improve Outcome: Progressing   Problem: Pain Managment: Goal: General experience of comfort will improve Outcome: Progressing   Problem: Coping: Goal: Level of anxiety will decrease Outcome: Progressing   Problem: Clinical Measurements: Goal: Ability to maintain clinical measurements within normal limits will improve Outcome: Progressing

## 2022-11-06 NOTE — Progress Notes (Signed)
NAME:  Timothy Mcgrath, MRN:  [id number], DOB:  09-Dec-1980, LOS: 3 ADMISSION DATE:  11/03/2022, CONSULTATION DATE: 11/03/2022 REFERRING MD: Dr. Tonia Brooms, CHIEF COMPLAINT: Left pleural effusion  History of Present Illness:  42 year old male patient referred from our pulmonary office for left pleural effusion. Treated back in August for initially what was felt to be a pneumonia, presented with fever chills cough with pleuritic type chest pain felt better following completion of antibiotics, a follow-up CT chest was ordered and completed raising concern for possible lung mass. He was therefore sent for a PET scan on August 30 this revealed a 3 x 2.7 cm left lower lobe mass. It was hypermetabolic and therefore he was referred to pulmonary for evaluation. On the time of imaging he also had a moderate-sized left pleural effusion. He has been referred for hospital admission for left thoracostomy tube placement and evaluation and drainage of pleural fluid   Pertinent  Medical History  Hypertension, seasonal allergies  Significant Hospital Events: Including procedures, antibiotic start and stop dates in addition to other pertinent events   9/11 admitted, chest tube placed  Interim History / Subjective:  No overnight events Chest tube output low, CXR demonstrates near total removal of fluid Tiny apical pneumothorax stable on waterseal for 24 hours  Objective   Blood pressure 125/89, pulse 81, temperature 98.4 F (36.9 C), temperature source Oral, resp. rate 14, height 5\' 6"  (1.676 m), weight 75.8 kg, SpO2 98%.        Intake/Output Summary (Last 24 hours) at 11/06/2022 1515 Last data filed at 11/06/2022 1000 Gross per 24 hour  Intake 333 ml  Output 10 ml  Net 323 ml   Filed Weights   11/04/22 0500 11/05/22 0529 11/06/22 0500  Weight: 79.5 kg 76.6 kg 75.8 kg    Examination: General: Middle-age, does not appear to be in distress HENT: Moist oral mucosa Lungs: Good air entry  bilaterally Cardiovascular: S1-S2 appreciated Abdomen: Soft, bowel sounds appreciated Extremities: No clubbing, no edema Neuro: Alert and oriented x 3   Resolved Hospital Problem list     Assessment & Plan:  Left pleural effusion - parapneumonic suspected: Associated with Pna vs possible malignancy. Completed course of abx prior to admission with initial imaging 09/2022. Tiny PTX -S/p chest tube placement -Fluid is exudative -Cytology is negative for malignancy -gram stain negative, NGTD on fluid culture - Additional 10 days Augmentin BID out of abundance of caution re: infection - Plan repeat CT in ~4 weeks since cytology negative - Output diminished, < 200 cc last 24 hrs and CXR shows total evacuation of effusion -Tiny apical PTX, no air leak with forced expiration on water seal, tiny stable pneumothorax on 24 hours waterseal, clamping trial with serial repeat chest x-ray  History of hypertension: BP elevated after stopping amlodipine -Continue amlodipine (re-ordered today), metoprolol, and lisinopril   Best Practice (right click and "Reselect all SmartList Selections" daily)   Diet/type: Regular consistency (see orders) DVT prophylaxis: SCD GI prophylaxis: N/A Lines: N/A Foley:  N/A Code Status:  full code Last date of multidisciplinary goals of care discussion [discussed ongoing care with patient]  Labs   CBC: Recent Labs  Lab 11/03/22 1516  WBC 6.6  HGB 14.4  HCT 41.6  MCV 86.0  PLT 385    Basic Metabolic Panel: Recent Labs  Lab 11/03/22 1516 11/04/22 0411  NA 131* 130*  K 3.2* 3.4*  CL 95* 98  CO2 28 23  GLUCOSE 96 101*  BUN 19 22*  CREATININE 0.76 0.90  CALCIUM 8.8* 8.9   Karren Burly, MD [facility] Pager: See Loretha Stapler

## 2022-11-07 ENCOUNTER — Telehealth: Payer: Self-pay | Admitting: Pulmonary Disease

## 2022-11-07 ENCOUNTER — Encounter (HOSPITAL_COMMUNITY): Payer: Self-pay | Admitting: Pulmonary Disease

## 2022-11-07 ENCOUNTER — Inpatient Hospital Stay (HOSPITAL_COMMUNITY): Payer: 59

## 2022-11-07 DIAGNOSIS — I10 Essential (primary) hypertension: Secondary | ICD-10-CM | POA: Diagnosis present

## 2022-11-07 DIAGNOSIS — J90 Pleural effusion, not elsewhere classified: Secondary | ICD-10-CM | POA: Diagnosis not present

## 2022-11-07 DIAGNOSIS — R918 Other nonspecific abnormal finding of lung field: Secondary | ICD-10-CM

## 2022-11-07 MED ORDER — AMOXICILLIN-POT CLAVULANATE 875-125 MG PO TABS: 1.0000 | ORAL_TABLET | Freq: Two times a day (BID) | ORAL | 0 refills | Status: AC

## 2022-11-07 NOTE — Progress Notes (Signed)
Chest tube pulled dressing is saturated with serous drainage. Vaseline gauze intact. Outer dressing changed.

## 2022-11-07 NOTE — Progress Notes (Signed)
The patient is stable, alert, oriented x4 and ambulatory without assistance. Chest tube insertion site is clean, dry intact. Supplies provided for dressing change if needed. Discharge instructions were reviewed. Questions, concerns denied at this time.

## 2022-11-07 NOTE — Discharge Summary (Signed)
Physician Discharge Summary   Patient ID: Timothy Mcgrath [id number] 42 y.o. 25-Aug-1980  Admit date: 11/03/2022  Discharge date and time: No discharge date for patient encounter.   Admitting Physician: Tomma Lightning, MD   Discharge Physician: Karren Burly, MD  Admission Diagnoses: Pleural effusion [J90]  Discharge Diagnoses:  Principal Problem:   Pleural effusion Active Problems:   Mass of lower lobe of left lung   Abnormal PET of left lung   HTN (hypertension)    Admission Condition: fair  Discharged Condition: fair  Indication for Admission: Complex pleural effusion  Hospital Course:   42 year old man recent diagnosis of lung mass treated for pneumonia presented to pulmonary clinic.  Images demonstrate enlarging pleural effusion.  Mass is PET avid.  Concern for actual pneumonia as opposed to malignancy although malignancy remains on differential diagnosis.  Chest tube was placed.  Drained easily without additional intervention.  Drainage was less than 200 cc per 24 hours.  Small apical pneumothorax was present.  There was no leak on waterseal.  On the waterseal trials pneumothorax was stable.  Chest tube was clamped.  Pneumothorax was stable.  Chest tube remained clamped overnight and pneumothorax remained stable.  Subsequently, chest tube was removed without complication.  Pleural fluid was exudative.  No signs of infection.  No malignant cells on cytology.  He is to complete 10 days of additional antibiotics.  Outpatient follow-up will be arranged, would benefit from additional imaging in the future.  Consults:  n/a  Significant Diagnostic Studies: As per EMR  Treatments: As discussed above  Discharge Exam: General: Sitting in chair in no acute distress Eyes: EOMI, icterus Neck: Supple, JVP Pulmonary: No work of breathing on room air Cardiovascular warm, no edema Skin: Chest tube insertion clean dry and intact.  Disposition: Discharge disposition: 01-Home  or Self Care       Patient Instructions:  Allergies as of 11/07/2022       Reactions   Iodinated Contrast Media Nausea And Vomiting        Medication List     TAKE these medications    amLODipine 10 MG tablet Commonly known as: NORVASC Take 10 mg by mouth daily.   amoxicillin-clavulanate 875-125 MG tablet Commonly known as: AUGMENTIN Take 1 tablet by mouth every 12 (twelve) hours for 10 days.   lisinopril 20 MG tablet Commonly known as: ZESTRIL Take 20 mg by mouth daily.   Mens Multivitamin Tabs Take 1 tablet by mouth daily with breakfast.   metoprolol succinate 25 MG 24 hr tablet Commonly known as: TOPROL-XL Take 25 mg by mouth daily.       Activity: activity as tolerated Diet: regular diet Wound Care:  Keep occlusive dressing 48 hours, do not submerge, okay to remove dressing after 48 hours and resume normal activities  Follow-up with pulmonary clinic in 4 week.  SignedLesia Sago Jennilyn Esteve 11/07/2022 1:20 PM

## 2022-11-07 NOTE — Telephone Encounter (Signed)
Please schedule follow-up with Dr. Tonia Brooms or APP in the next 2 weeks.  Suspect would benefit from additional imaging to evaluate status of mass.  Would recommend a CT chest super D without contrast sometime early to mid October unless Dr. Tonia Brooms recommends different imaging.  Thank you!

## 2022-11-07 NOTE — Progress Notes (Signed)
Patient's chest tube is clamped for the night for clamping trial with serial repeat chest x-ray per Vilma Meckel, MD notes. Patient states he was made aware by MD.

## 2022-11-08 NOTE — Telephone Encounter (Signed)
Dr Tonia Brooms,   You saw patient on 9/9 and there was no recommended follow-up for him. As of right now, you have availability on 9/25 to get him again. Is this what you would suggest, or does he just need another CT ordered? Please advise.

## 2022-11-09 NOTE — Telephone Encounter (Signed)
Chest CT w/o contrast order placed with note to schedule f/u with Dr Tonia Brooms or App to review CT results.

## 2022-11-10 NOTE — Telephone Encounter (Signed)
Appt scheduled for 11/17/22 at 10:00. Called and left VM for pt to see if time/date is ok for him.

## 2022-11-12 NOTE — Telephone Encounter (Signed)
Called and spoke to pt. Pt confirmed the appt time and date. Pt prefers the afternoon due to work. Will check schedule on 9/23 to see if there is cancellation to move pt to the afternoon. As of now, pt will come in on 9/25 at 10:00.

## 2022-11-13 ENCOUNTER — Ambulatory Visit (HOSPITAL_BASED_OUTPATIENT_CLINIC_OR_DEPARTMENT_OTHER)
Admission: RE | Admit: 2022-11-13 | Discharge: 2022-11-13 | Disposition: A | Discharge status: Home / Self Care | Payer: 59 | Source: Ambulatory Visit | Attending: Pulmonary Disease | Admitting: Pulmonary Disease

## 2022-11-13 DIAGNOSIS — R918 Other nonspecific abnormal finding of lung field: Secondary | ICD-10-CM | POA: Insufficient documentation

## 2022-11-17 ENCOUNTER — Ambulatory Visit: Payer: 59 | Admitting: Pulmonary Disease

## 2022-11-23 ENCOUNTER — Telehealth: Payer: Self-pay | Admitting: Acute Care

## 2022-11-24 NOTE — Telephone Encounter (Signed)
Patient is scheduled to see Dr Tonia Brooms on 10/17. Nothing further needed.

## 2022-12-06 ENCOUNTER — Telehealth: Payer: Self-pay | Admitting: Pulmonary Disease

## 2022-12-06 NOTE — Telephone Encounter (Signed)
Call Report from [facility] Radiology Reading Room.  Call from [facility].  CT Chest w/o contrast: IMPRESSION: Previous catheter no longer seen in the left hemithorax. However there is developing large left pleural effusion with the adjacent opacities and pleural thickening. Additional bandlike areas of nodular opacity in the left upper lobe. Please correlate with clinical presentation and if needed a contrast CT may be of some benefit to further delineate these findings.   Findings will be called to the ordering service by the Radiology physician assistant team   Patient of Dr. Tonia Brooms.  For your FYI.  Thank you.  Will also send to DOD, Dr. Maple Hudson.  (Dr. Tonia Brooms is not on schedule in [place] today)

## 2022-12-06 NOTE — Telephone Encounter (Signed)
He has an appointment 10/17 with Dr Tonia Brooms to follow up on this imaging. He is being followed for known pleural effusion. If he is in acute distress, he should go to ED, but otherwise ok to wait for ov with Dr Tonia Brooms.

## 2022-12-06 NOTE — Telephone Encounter (Signed)
[facility] calling w/Call Report. Pls call [phone]

## 2022-12-06 NOTE — Telephone Encounter (Signed)
Called patient.  Gave all information.  Patient verbalized understanding.  Patient will keep OV with Dr. Tonia Brooms on 12/09/2022.

## 2022-12-09 ENCOUNTER — Encounter: Payer: Self-pay | Admitting: Pulmonary Disease

## 2022-12-09 ENCOUNTER — Ambulatory Visit: Payer: 59 | Admitting: Pulmonary Disease

## 2022-12-09 ENCOUNTER — Other Ambulatory Visit: Payer: Self-pay

## 2022-12-09 VITALS — BP 124/78 | HR 94 | Temp 97.1°F | Ht 66.0 in | Wt 173.2 lb

## 2022-12-09 DIAGNOSIS — J90 Pleural effusion, not elsewhere classified: Secondary | ICD-10-CM

## 2022-12-09 DIAGNOSIS — R918 Other nonspecific abnormal finding of lung field: Secondary | ICD-10-CM

## 2022-12-09 DIAGNOSIS — R942 Abnormal results of pulmonary function studies: Secondary | ICD-10-CM | POA: Diagnosis not present

## 2022-12-09 NOTE — Progress Notes (Signed)
Synopsis: Referred in September 2024 for lung mass by Farris Has, MD  Subjective:   PATIENT ID: Timothy Mcgrath GENDER: male DOB: 1980-10-10, MRN: [id number]  Chief Complaint  Patient presents with   Follow-up    Review CT    This is a 42 year old gentleman, no significant past medical history.Patient was referred after having abnormal CT imaging of the chest in August 2024 as well as abnormal nuclear medicine pet imaging.  Patient had a PET scan that was completed on October 22, 2022 which reveals a 3.0 x 2.7 cm left lower lobe mass it is hypermetabolic concerning for primary bronchogenic carcinoma.  Also there was a tiny focus of uptake within the patient's left hilum.  No other evidence of metastatic disease.  He has a 13 mm left thyroid nodule as well.  Patient had a CT scan of the chest to with ongoing fever and cough.  He was diagnosed with pneumonia treated with antibiotics.  He is feeling better.  Follow-up nuclear medicine pet imaging demonstrated a worsening left-sided pleural effusion and the mass in the chest is change not significantly changed.  OV 12/09/2022: Here today for follow-up recently sent to the hospital treated with tube thoracostomy and drainage.  Evaluation shows exudative effusion with lymphocyte predominance.  He still having some pain from the prior tube insertion site.  Repeat CT scan of the chest completed prior to today's office visit that shows recurrence of pleural effusion.  Patient is relatively asymptomatic from this.    Past Medical History:  Diagnosis Date   Hypertension      No family history on file.   Past Surgical History:  Procedure Laterality Date   CHEST TUBE INSERTION Left 11/03/2022   Procedure: CHEST TUBE INSERTION;  Surgeon: Tomma Lightning, MD;  Location: [facility];  Service: Pulmonary;  Laterality: Left;    Social History   Socioeconomic History   Marital status: Married    Spouse name: Not on file   Number of children:  Not on file   Years of education: Not on file   Highest education level: Not on file  Occupational History   Not on file  Tobacco Use   Smoking status: Never   Smokeless tobacco: Never  Substance and Sexual Activity   Alcohol use: Not on file   Drug use: Not on file   Sexual activity: Not on file  Other Topics Concern   Not on file  Social History Narrative   Not on file   Social Determinants of Health   Financial Resource Strain: Not on file  Food Insecurity: No Food Insecurity (11/03/2022)   Hunger Vital Sign    Worried About Running Out of Food in the Last Year: Never true    Ran Out of Food in the Last Year: Never true  Transportation Needs: No Transportation Needs (11/03/2022)   PRAPARE - Administrator, Civil Service (Medical): No    Lack of Transportation (Non-Medical): No  Physical Activity: Not on file  Stress: Not on file  Social Connections: Not on file  Intimate Partner Violence: Not At Risk (11/03/2022)   Humiliation, Afraid, Rape, and Kick questionnaire    Fear of Current or Ex-Partner: No    Emotionally Abused: No    Physically Abused: No    Sexually Abused: No     Allergies  Allergen Reactions   Iodinated Contrast Media Nausea And Vomiting     Outpatient Medications Prior to Visit  Medication Sig Dispense Refill  amLODipine (NORVASC) 10 MG tablet Take 10 mg by mouth daily.     lisinopril (ZESTRIL) 20 MG tablet Take 20 mg by mouth daily.     metoprolol succinate (TOPROL-XL) 25 MG 24 hr tablet Take 25 mg by mouth daily.     Multiple Vitamins-Minerals (MENS MULTIVITAMIN) TABS Take 1 tablet by mouth daily with breakfast.     No facility-administered medications prior to visit.    Review of Systems  Constitutional:  Negative for chills, fever, malaise/fatigue and weight loss.  HENT:  Negative for hearing loss, sore throat and tinnitus.   Eyes:  Negative for blurred vision and double vision.  Respiratory:  Positive for cough and shortness  of breath. Negative for hemoptysis, sputum production, wheezing and stridor.   Cardiovascular:  Negative for chest pain, palpitations, orthopnea, leg swelling and PND.  Gastrointestinal:  Negative for abdominal pain, constipation, diarrhea, heartburn, nausea and vomiting.  Genitourinary:  Negative for dysuria, hematuria and urgency.  Musculoskeletal:  Negative for joint pain and myalgias.  Skin:  Negative for itching and rash.  Neurological:  Negative for dizziness, tingling, weakness and headaches.  Endo/Heme/Allergies:  Negative for environmental allergies. Does not bruise/bleed easily.  Psychiatric/Behavioral:  Negative for depression. The patient is not nervous/anxious and does not have insomnia.   All other systems reviewed and are negative.    Objective:  Physical Exam Vitals reviewed.  Constitutional:      General: He is not in acute distress.    Appearance: He is well-developed.  HENT:     Head: Normocephalic and atraumatic.  Eyes:     General: No scleral icterus.    Conjunctiva/sclera: Conjunctivae normal.     Pupils: Pupils are equal, round, and reactive to light.  Neck:     Vascular: No JVD.     Trachea: No tracheal deviation.  Cardiovascular:     Rate and Rhythm: Normal rate and regular rhythm.     Heart sounds: Normal heart sounds. No murmur heard. Pulmonary:     Effort: Pulmonary effort is normal. No tachypnea, accessory muscle usage or respiratory distress.     Breath sounds: No stridor. No wheezing, rhonchi or rales.     Comments: Absent breath sounds in the left base Abdominal:     General: Bowel sounds are normal. There is no distension.     Palpations: Abdomen is soft.     Tenderness: There is no abdominal tenderness.  Musculoskeletal:        General: No tenderness.     Cervical back: Neck supple.  Lymphadenopathy:     Cervical: No cervical adenopathy.  Skin:    General: Skin is warm and dry.     Capillary Refill: Capillary refill takes less than 2  seconds.     Findings: No rash.  Neurological:     Mental Status: He is alert and oriented to person, place, and time.  Psychiatric:        Behavior: Behavior normal.      Vitals:   12/09/22 1110  BP: 124/78  Pulse: 94  Temp: (!) 97.1 F (36.2 C)  TempSrc: Temporal  SpO2: 98%  Weight: 173 lb 3.2 oz (78.6 kg)  Height: 5\' 6"  (1.676 m)    98% on RA BMI Readings from Last 3 Encounters:  12/09/22 27.96 kg/m  11/07/22 27.15 kg/m  11/01/22 28.02 kg/m   Wt Readings from Last 3 Encounters:  12/09/22 173 lb 3.2 oz (78.6 kg)  11/07/22 168 lb 3.4 oz (76.3 kg)  11/01/22  173 lb 9.6 oz (78.7 kg)     CBC    Component Value Date/Time   WBC 6.6 11/03/2022 1516   RBC 4.84 11/03/2022 1516   HGB 14.4 11/03/2022 1516   HCT 41.6 11/03/2022 1516   PLT 385 11/03/2022 1516   MCV 86.0 11/03/2022 1516   MCH 29.8 11/03/2022 1516   MCHC 34.6 11/03/2022 1516   RDW 11.5 11/03/2022 1516      Chest Imaging: Nuclear medicine pet imaging August 2024: Left lower lobe effusion progressed since last CT, hypermetabolic lesion. To me appears like this is pneumonia with a progressive effusion. The patient's images have been independently reviewed by me.    October 2024: CT chest with recurrent left-sided pleural effusion The patient's images have been independently reviewed by me.    Pulmonary Functions Testing Results:     No data to display          FeNO:   Pathology:   Echocardiogram:   Heart Catheterization:     Assessment & Plan:     ICD-10-CM   1. Mass of lower lobe of left lung  R91.8     2. Abnormal PET of left lung  R94.2     3. Pleural effusion  J90 Ambulatory referral to Cardiothoracic Surgery       Discussion:  This is a 42 year old gentleman with a new left-sided effusion had hospitalization with tube thoracostomy.  Initially had presented with pneumonia type symptoms.  Did not have a thoracentesis.  I am glad the fluid has drained however repeat CT  scan of the chest after a few weeks has shown reaccumulation of the left sided pleural effusion.  Plan: Since he is young and has no other significant medical comorbidities with a lymphocyte predominant effusion that is unilateral I do think that he would benefit from pleuroscopy and consideration for pleural biopsies. We need to make sure that were not dealing with malignancy.  He is not interested in having another chest tube or thoracentesis if possible. He would like to have everything done under general anesthesia and diagnosis and problem fixed. I do think this is potentially possible with pleural intervention. I have messaged our cardiothoracic surgery team to take a look at his case for consideration. Referral placed to cardiothoracic surgery.    Current Outpatient Medications:    amLODipine (NORVASC) 10 MG tablet, Take 10 mg by mouth daily., Disp: , Rfl:    lisinopril (ZESTRIL) 20 MG tablet, Take 20 mg by mouth daily., Disp: , Rfl:    metoprolol succinate (TOPROL-XL) 25 MG 24 hr tablet, Take 25 mg by mouth daily., Disp: , Rfl:    Multiple Vitamins-Minerals (MENS MULTIVITAMIN) TABS, Take 1 tablet by mouth daily with breakfast., Disp: , Rfl:    Josephine Igo, DO [facility] 12/09/2022 11:22 AM

## 2022-12-09 NOTE — Patient Instructions (Signed)
Thank you for visiting Dr. Tonia Brooms at [facility]. Today we recommend the following:  Orders Placed This Encounter  Procedures   Ambulatory referral to Cardiothoracic Surgery   Return if symptoms worsen or fail to improve.    Please do your part to reduce the spread of COVID-19.

## 2022-12-10 ENCOUNTER — Inpatient Hospital Stay: Admission: RE | Admit: 2022-12-10 | Payer: 59 | Source: Ambulatory Visit

## 2022-12-10 ENCOUNTER — Other Ambulatory Visit: Payer: Self-pay | Admitting: Thoracic Surgery (Cardiothoracic Vascular Surgery)

## 2022-12-10 DIAGNOSIS — J90 Pleural effusion, not elsewhere classified: Secondary | ICD-10-CM

## 2022-12-13 ENCOUNTER — Encounter: Payer: 59 | Admitting: Thoracic Surgery (Cardiothoracic Vascular Surgery)

## 2022-12-13 ENCOUNTER — Other Ambulatory Visit: Payer: Self-pay

## 2022-12-14 ENCOUNTER — Other Ambulatory Visit: Payer: Self-pay | Admitting: Thoracic Surgery (Cardiothoracic Vascular Surgery)

## 2022-12-14 ENCOUNTER — Ambulatory Visit
Admission: RE | Admit: 2022-12-14 | Discharge: 2022-12-14 | Disposition: A | Discharge status: Home / Self Care | Payer: 59 | Source: Ambulatory Visit | Attending: Thoracic Surgery (Cardiothoracic Vascular Surgery) | Admitting: Thoracic Surgery (Cardiothoracic Vascular Surgery)

## 2022-12-14 ENCOUNTER — Institutional Professional Consult (permissible substitution) (INDEPENDENT_AMBULATORY_CARE_PROVIDER_SITE_OTHER): Payer: 59 | Admitting: Thoracic Surgery (Cardiothoracic Vascular Surgery)

## 2022-12-14 VITALS — BP 138/98 | HR 80 | Resp 20 | Ht 66.0 in | Wt 170.0 lb

## 2022-12-14 DIAGNOSIS — J90 Pleural effusion, not elsewhere classified: Secondary | ICD-10-CM

## 2022-12-14 NOTE — Progress Notes (Signed)
PCP is Farris Has, MD Referring Provider is Icard, Rachel Bo, DO  Chief Complaint  Patient presents with   Pleural Effusion    Surgical consult/ CXR-today     HPI: Timothy Mcgrath sent for consultation regarding a loculated left pleural effusion.  Timothy Mcgrath is a 42 year old gentleman with a history of hypertension and pneumonia.  He had a pneumonia this summer.  That was treated with antibiotics but he had persistent fever and cough.  He ultimately had a CT on 10/16/2022 which showed a 3.3 x 2.6 cm area of consolidation in the left lower lobe.  There also was a mass noted in the anterior superior mediastinum.  Possibly arising from the thyroid.  PET about a week later showed a new small to moderate left pleural effusion.  Noted to have hypermetabolic thyroid nodule.  Underwent an ultrasound which recommended biopsy but it does not appear a biopsy has been done.  He had a chest tube placed with a good radiographic result.  Cytology on the fluid was negative.  Tube was removed and he was discharged home on 11/07/2022.  He came back with a repeat CT on 11/13/2022 which showed a new large left pleural effusion.  He feels well.  No cough, chest pain, shortness of breath, fevers, or chills.  No respiratory issues.   Past Medical History:  Diagnosis Date   Hypertension     Past Surgical History:  Procedure Laterality Date   CHEST TUBE INSERTION Left 11/03/2022   Procedure: CHEST TUBE INSERTION;  Surgeon: Tomma Lightning, MD;  Location: [facility];  Service: Pulmonary;  Laterality: Left;    No family history on file.  Social History Social History   Tobacco Use   Smoking status: Never   Smokeless tobacco: Never    Current Outpatient Medications  Medication Sig Dispense Refill   amLODipine (NORVASC) 10 MG tablet Take 10 mg by mouth daily.     lisinopril (ZESTRIL) 20 MG tablet Take 20 mg by mouth daily.     metoprolol succinate (TOPROL-XL) 25 MG 24 hr tablet Take 25 mg by mouth  daily.     Multiple Vitamins-Minerals (MENS MULTIVITAMIN) TABS Take 1 tablet by mouth daily with breakfast.     No current facility-administered medications for this visit.    Allergies  Allergen Reactions   Iodinated Contrast Media Nausea And Vomiting    Review of Systems  Constitutional:  Negative for chills, fever and unexpected weight change.  Respiratory:  Negative for cough, shortness of breath and wheezing.   Cardiovascular:  Negative for chest pain.  All other systems reviewed and are negative.   BP (!) 138/98   Pulse 80   Resp 20   Ht 5\' 6"  (1.676 m)   Wt 170 lb (77.1 kg)   SpO2 96% Comment: RA  BMI 27.44 kg/m  Physical Exam Constitutional:      General: He is not in acute distress.    Appearance: Normal appearance.  HENT:     Head: Normocephalic and atraumatic.  Eyes:     General: No scleral icterus.    Extraocular Movements: Extraocular movements intact.  Cardiovascular:     Rate and Rhythm: Normal rate and regular rhythm.     Heart sounds: Normal heart sounds. No murmur heard.    No friction rub. No gallop.  Pulmonary:     Effort: Pulmonary effort is normal. No respiratory distress.     Breath sounds: Normal breath sounds. No wheezing.  Abdominal:  General: There is no distension.     Palpations: Abdomen is soft.  Musculoskeletal:     Cervical back: Neck supple.  Skin:    General: Skin is warm and dry.  Neurological:     General: No focal deficit present.     Mental Status: He is alert and oriented to person, place, and time.     Cranial Nerves: No cranial nerve deficit.     Motor: No weakness.    Diagnostic Tests: CT CHEST WITHOUT CONTRAST   TECHNIQUE: Multidetector CT imaging of the chest was performed following the standard protocol without IV contrast.   RADIATION DOSE REDUCTION: This exam was performed according to the departmental dose-optimization program which includes automated exposure control, adjustment of the mA and/or kV  according to patient size and/or use of iterative reconstruction technique.   COMPARISON:  PET-CT 10/22/2022. Chest CT 10/16/2022. More recent x-rays 11/07/2022 and older   FINDINGS: Cardiovascular: Trace pericardial effusion. Heart is nonenlarged. Grossly the thoracic aorta has a normal course and caliber.   Mediastinum/Nodes: Nodular heterogeneous thyroid gland. Normal caliber thoracic esophagus. No specific abnormal lymph node enlargement identified in the axillary region, hilum or mediastinum.   Lungs/Pleura: Right lung is grossly clear. Breathing motion. No right-sided effusion or consolidation. Previous pigtail catheter in the left lung base is no longer identified by the prior x-ray. However there is a new large left pleural effusion with some pleural thickening and adjacent parenchymal nodular opacities in the left lower lobe in some bandlike areas in the left upper lobe.   Upper Abdomen: Adrenal glands are preserved in the upper abdomen.   Musculoskeletal: No chest wall mass or suspicious bone lesions identified.   IMPRESSION: Previous catheter no longer seen in the left hemithorax. However there is developing large left pleural effusion with the adjacent opacities and pleural thickening. Additional bandlike areas of nodular opacity in the left upper lobe. Please correlate with clinical presentation and if needed a contrast CT may be of some benefit to further delineate these findings.   Findings will be called to the ordering service by the Radiology physician assistant team     Electronically Signed   By: Karen Kays M.D.   On: 12/04/2022 12:21 I personally reviewed the CT images.  There is a relatively large loculated left pleural effusion.  Opacity in the left lower lobe.  I reviewed his chest x-ray images from today.  No official report yet.  No apparent pleural effusion on chest x-ray images.  Impression: Timothy Mcgrath is a 42 year old gentleman with a  history of hypertension and pneumonia.    Loculated left pleural effusion-likely a parapneumonic effusion.  Was pretty extensive on a CT about a month ago but on today's chest x-ray there is really no effusion of significance at all.  Certainly not enough to recommend surgical intervention.  I recommended that we repeat a CT in about a month to ensure the effusion has resolved and also to follow-up on the opacity in the left lung.  Thyroid nodules-noted on CT and PET.  Thyroid ultrasound showed nodules with indication for biopsy.  Will follow-up on that at his next visit.  Plan: Return in 1 month with CT Will need follow-up on thyroid.  I spent over 30 minutes in review of records, images, and in consultation with Timothy Mcgrath today. Loreli Slot, MD [facility] [phone]

## 2022-12-16 ENCOUNTER — Other Ambulatory Visit: Payer: Self-pay | Admitting: Thoracic Surgery (Cardiothoracic Vascular Surgery)

## 2022-12-16 DIAGNOSIS — J90 Pleural effusion, not elsewhere classified: Secondary | ICD-10-CM

## 2023-01-18 ENCOUNTER — Inpatient Hospital Stay: Admission: RE | Admit: 2023-01-18 | Payer: 59 | Source: Ambulatory Visit

## 2023-01-18 ENCOUNTER — Other Ambulatory Visit: Payer: 59

## 2023-01-25 ENCOUNTER — Ambulatory Visit: Payer: 59 | Admitting: Thoracic Surgery (Cardiothoracic Vascular Surgery)

## 2023-02-02 ENCOUNTER — Other Ambulatory Visit (HOSPITAL_COMMUNITY)
Admission: RE | Admit: 2023-02-02 | Discharge: 2023-02-02 | Disposition: A | Discharge status: Home / Self Care | Payer: 59 | Source: Ambulatory Visit | Attending: Family Medicine | Admitting: Family Medicine

## 2023-02-02 ENCOUNTER — Ambulatory Visit
Admission: RE | Admit: 2023-02-02 | Discharge: 2023-02-02 | Disposition: A | Discharge status: Home / Self Care | Payer: 59 | Source: Ambulatory Visit | Attending: Family Medicine | Admitting: Family Medicine

## 2023-02-02 DIAGNOSIS — E041 Nontoxic single thyroid nodule: Secondary | ICD-10-CM | POA: Diagnosis present

## 2023-02-04 ENCOUNTER — Other Ambulatory Visit: Payer: 59

## 2023-02-04 LAB — CYTOLOGY - NON PAP

## 2023-02-08 ENCOUNTER — Encounter: Payer: Self-pay | Admitting: Thoracic Surgery (Cardiothoracic Vascular Surgery)

## 2023-02-08 ENCOUNTER — Ambulatory Visit (INDEPENDENT_AMBULATORY_CARE_PROVIDER_SITE_OTHER): Payer: 59 | Admitting: Thoracic Surgery (Cardiothoracic Vascular Surgery)

## 2023-02-08 ENCOUNTER — Ambulatory Visit
Admission: RE | Admit: 2023-02-08 | Discharge: 2023-02-08 | Disposition: A | Discharge status: Home / Self Care | Payer: 59 | Source: Ambulatory Visit | Attending: Thoracic Surgery (Cardiothoracic Vascular Surgery) | Admitting: Thoracic Surgery (Cardiothoracic Vascular Surgery)

## 2023-02-08 ENCOUNTER — Other Ambulatory Visit: Payer: Self-pay | Admitting: Thoracic Surgery (Cardiothoracic Vascular Surgery)

## 2023-02-08 VITALS — BP 150/100 | HR 72 | Resp 18 | Ht 66.0 in | Wt 176.0 lb

## 2023-02-08 DIAGNOSIS — J90 Pleural effusion, not elsewhere classified: Secondary | ICD-10-CM

## 2023-02-08 NOTE — Progress Notes (Signed)
[address]       [place] [address]             [phone]     HPI: Timothy Mcgrath returns for follow-up of a left pleural effusion and left upper lobe pulmonary opacity.  Timothy Mcgrath is a 42 year old man with a history of hypertension and pneumonia.  He had pneumonia last summer.  He was treated with antibiotics but had a persistent fever and cough.  CT showed a 3.3 x 2.6 cm area of consolidation in the left lower lobe.  PET/CT about a week later showed a new small to moderate left pleural effusion.  He had a chest tube placed initially there was resolution of the effusion but he came back about a week later with a new large left pleural effusion.  He was referred for possible surgical intervention but by the time I saw him, another chest x-ray showed the effusion had essentially resolved.  He had had a thyroid ultrasound which recommended biopsy.  I went ahead and scheduled an FNA just to make sure that did not fall through the cracks.  In the interim since his last visit he has been feeling well.  He did have his thyroid FNA last week.  No respiratory issues.  Past Medical History:  Diagnosis Date   Hypertension      Current Outpatient Medications  Medication Sig Dispense Refill   amLODipine (NORVASC) 10 MG tablet Take 10 mg by mouth daily.     lisinopril (ZESTRIL) 20 MG tablet Take 20 mg by mouth daily.     metoprolol succinate (TOPROL-XL) 25 MG 24 hr tablet Take 25 mg by mouth daily.     Multiple Vitamins-Minerals (MENS MULTIVITAMIN) TABS Take 1 tablet by mouth daily with breakfast.     No current facility-administered medications for this visit.    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   Official reading not done yet.  There is no effusion.  Impression: Timothy Mcgrath is a 42 year old man with history of hypertension and pneumonia complicated by parapneumonic effusion.  He had a large effusion in late September.  I saw him in October and the effusion had resolved.  I recommended he return in month with a CT of the chest to follow-up some parenchymal opacities in the left lower lobe and left upper lobe respectively.  Unfortunately only had a plain chest x-ray.  It does show complete resolution of his pleural effusions.  However cannot definitively rule out a lung nodule based on the plain chest x-ray films.  Will plan to have a CT of the chest done and return next month to rule out any lung nodules.  FNA of thyroid nodules performed on 02/02/2023.  1 nodule was benign.  The other nodule showed atypical cells.  Will refer to Dr. Gerrit Friends for consultation and management of his thyroid nodules.  Plan: Return in 1 month with CT chest to follow-up pulmonary opacities in the left lower lobe and left upper lobe noted on previous CTs Referral to Dr. Gerrit Friends regarding thyroid nodule with atypical cells on FNA.  Loreli Slot, MD [facility] [phone]

## 2023-02-15 ENCOUNTER — Encounter (HOSPITAL_COMMUNITY): Payer: Self-pay

## 2023-03-18 ENCOUNTER — Ambulatory Visit
Admission: RE | Admit: 2023-03-18 | Discharge: 2023-03-18 | Disposition: A | Discharge status: Home / Self Care | Payer: 59 | Source: Ambulatory Visit | Attending: Thoracic Surgery (Cardiothoracic Vascular Surgery) | Admitting: Thoracic Surgery (Cardiothoracic Vascular Surgery)

## 2023-03-18 DIAGNOSIS — J90 Pleural effusion, not elsewhere classified: Secondary | ICD-10-CM

## 2023-03-22 ENCOUNTER — Ambulatory Visit: Payer: 59 | Admitting: Thoracic Surgery (Cardiothoracic Vascular Surgery)

## 2023-04-19 ENCOUNTER — Ambulatory Visit: Payer: 59

## 2023-05-10 ENCOUNTER — Ambulatory Visit (INDEPENDENT_AMBULATORY_CARE_PROVIDER_SITE_OTHER): Payer: 59 | Admitting: Thoracic Surgery (Cardiothoracic Vascular Surgery)

## 2023-05-10 VITALS — BP 158/105 | HR 75 | Resp 18 | Ht 66.0 in | Wt 176.0 lb

## 2023-05-10 DIAGNOSIS — E042 Nontoxic multinodular goiter: Secondary | ICD-10-CM | POA: Diagnosis not present

## 2023-05-10 DIAGNOSIS — R918 Other nonspecific abnormal finding of lung field: Secondary | ICD-10-CM

## 2023-05-10 NOTE — Progress Notes (Signed)
 [address]       [place] [address]             [phone]     HPI: Mr. Timothy Mcgrath returns to discuss results of his follow-up CT scan  Timothy Mcgrath is a 43 year old man with a history of hypertension, pneumonia complicated by parapneumonic effusion, and thyroid nodules.  He had pneumonia in the summer 2024.  Treated with antibiotics but had a persistent fever and cough.  CT showed a 3.3 x 2.6 cm area of consolidation in the left lower lobe.  There was concern for possible mass.  PET/CT showed a new left pleural effusion.  He had a chest tube placed but had a recurrent effusion after that resolved.  He was referred for consideration for surgery but by the time he came to the office a chest x-ray showed the effusion had essentially resolved.  I scheduled a follow-up CT in January.  Not sure why he did not follow-up then but now returns to discuss those results.  He also had some thyroid nodules.  I placed a referral to Dr. Gerrit Friends.  I cannot find a note but he says he saw Dr. Gerrit Friends and was told there was a 4% risk of cancer.  He is not sure what the treatment plan is.  Past Medical History:  Diagnosis Date   Hypertension     Current Outpatient Medications  Medication Sig Dispense Refill   amLODipine (NORVASC) 10 MG tablet Take 10 mg by mouth daily.     lisinopril (ZESTRIL) 20 MG tablet Take 20 mg by mouth daily.     metoprolol succinate (TOPROL-XL) 25 MG 24 hr tablet Take 25 mg by mouth daily.     Multiple Vitamins-Minerals (MENS MULTIVITAMIN) TABS Take 1 tablet by mouth daily with breakfast.     No current facility-administered medications for this visit.    Physical Exam BP (!) 158/105 (BP Location: Right Arm)   Pulse 75   Resp 18   Ht 5\' 6"  (1.676 m)   Wt 176 lb (79.8 kg)   SpO2 96%   BMI 28.47 kg/m  43 year old man in no acute distress Alert and oriented x 3 with no focal deficits Well-developed and well-nourished  Diagnostic Tests: CT CHEST WITHOUT  CONTRAST   TECHNIQUE: Multidetector CT imaging of the chest was performed following the standard protocol without IV contrast.   RADIATION DOSE REDUCTION: This exam was performed according to the departmental dose-optimization program which includes automated exposure control, adjustment of the mA and/or kV according to patient size and/or use of iterative reconstruction technique.   COMPARISON:  Chest x-ray 02/08/2023.  Chest CT 11/13/2022.   FINDINGS: Cardiovascular: The heart size is normal. No substantial pericardial effusion. No thoracic aortic aneurysm. No substantial atherosclerosis of the thoracic aorta.   Mediastinum/Nodes: Multiple bilateral thyroid nodules again noted including dominant nodule in the isthmus with exophytic nodularity anterior mediastinum. This has been evaluated on previous imaging. (ref: J Am Coll Radiol. 2015 Feb;12(2): 143-50). No mediastinal lymphadenopathy. No evidence for gross hilar lymphadenopathy although assessment is limited by the lack of intravenous contrast on the current study. The esophagus has normal imaging features. There is no axillary lymphadenopathy.   Lungs/Pleura: No suspicious pulmonary nodule or mass. No focal airspace consolidation. No pleural effusion. Linear band like areas of atelectasis or scarring are noted in the lower lungs bilaterally   Upper Abdomen: Tiny nonobstructing stones are seen in the upper pole left kidney, incompletely  visualized. The visualized portion of the upper abdomen is otherwise unremarkable.   Musculoskeletal: No worrisome lytic or sclerotic osseous abnormality.   IMPRESSION: 1. No pleural effusion. The previously described bandlike areas of nodular opacity in the left upper lobe and left lower lobe have resolved with some residual thin linear density in these regions, likely scarring. 2. Multiple bilateral thyroid nodules again noted including dominant nodule in the isthmus with exophytic  nodularity anterior mediastinum. This has been evaluated on previous imaging. 3. Tiny nonobstructing stones in the upper pole left kidney, incompletely visualized.     Electronically Signed   By: Kennith Center M.D.   On: 03/18/2023 11:51 I personally reviewed the CT images.  No residual pleural effusion.  Resolved nodular opacities.  Thyroid nodules as described above.  Impression: Timothy Mcgrath is a 43 year old man with a history of hypertension, pneumonia complicated by parapneumonic effusion, and thyroid nodules.   CT chest shows resolution of this pleural effusion and consolidation.  No further follow-up necessary.  Thyroid nodules-I do not see a note from Dr. Gerrit Friends in Care everywhere.  Instructed Mr. Deweese to follow-up with Dr. Kateri Plummer and Dr. Gerrit Friends.  Plan: No further follow-up necessary for resolved pleural effusion and lung consolidation  Advised him to follow-up with Dr. Kateri Plummer and Dr. Gerrit Friends as soon as possible regarding his thyroid nodules.  Loreli Slot, MD [facility] [phone]

## 2023-08-14 IMAGING — CR DG CHEST 2V
3 series · 3 of 3 positions shown · non-contrast
Comparison: 02/08/2020

CLINICAL DATA: Left chest discomfort, cough and congestion

EXAM:
CHEST - 2 VIEW

[w chest pa (1 of 2)]
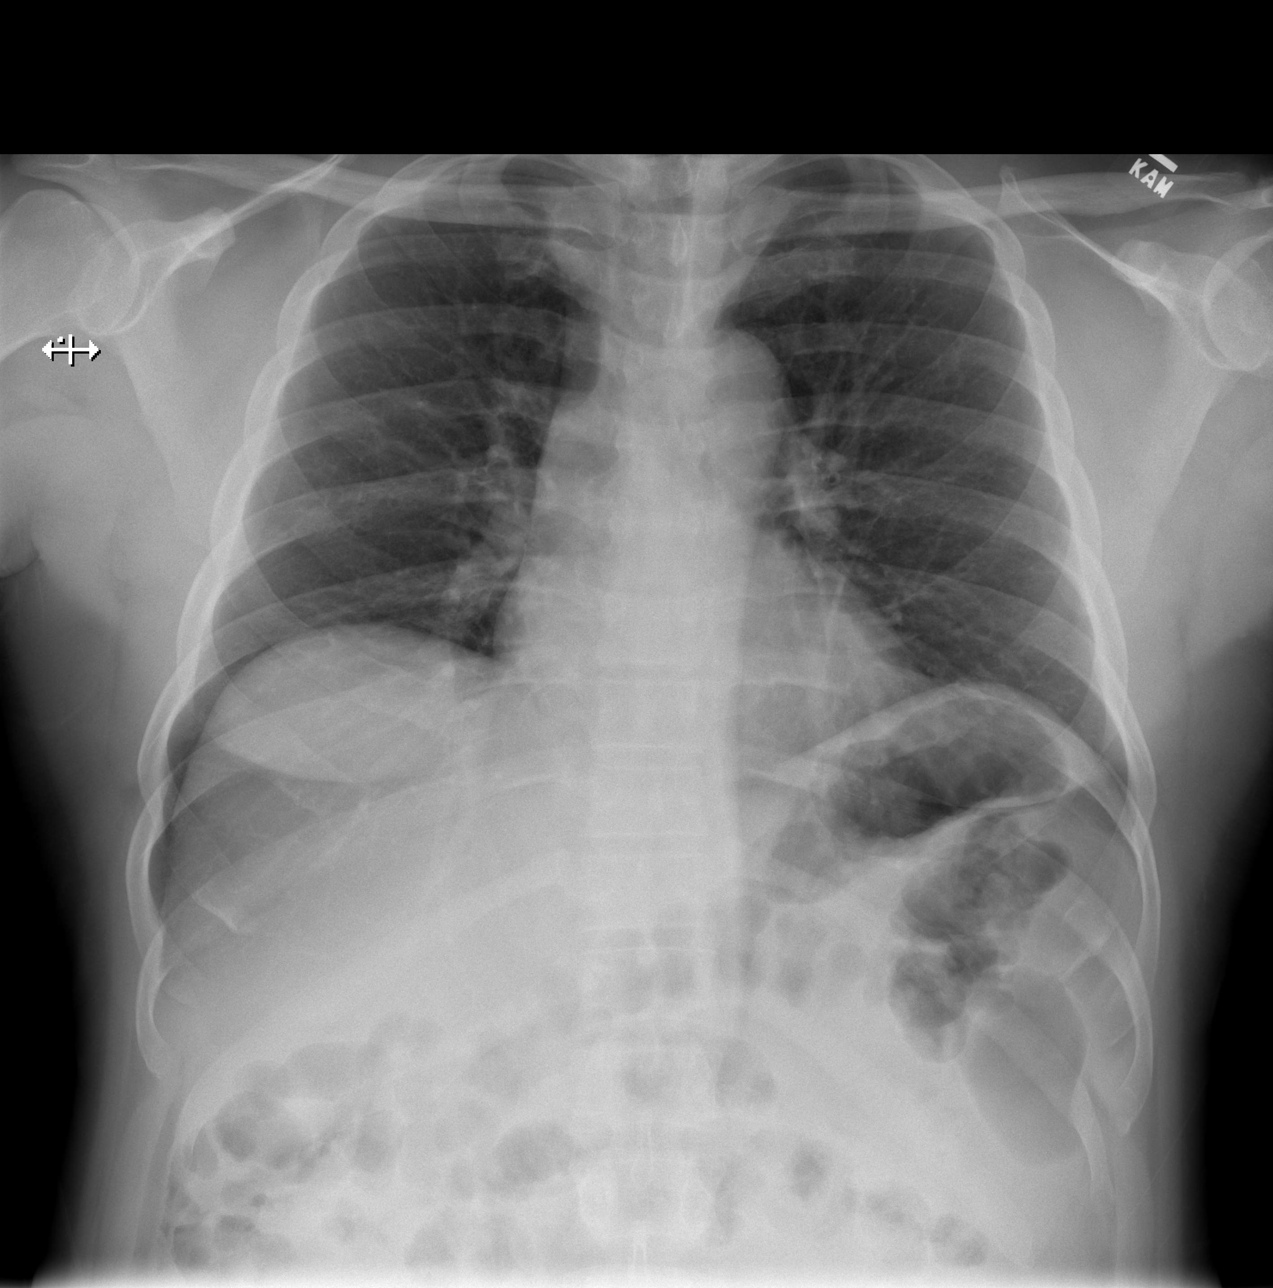

[w chest pa (2 of 2)]
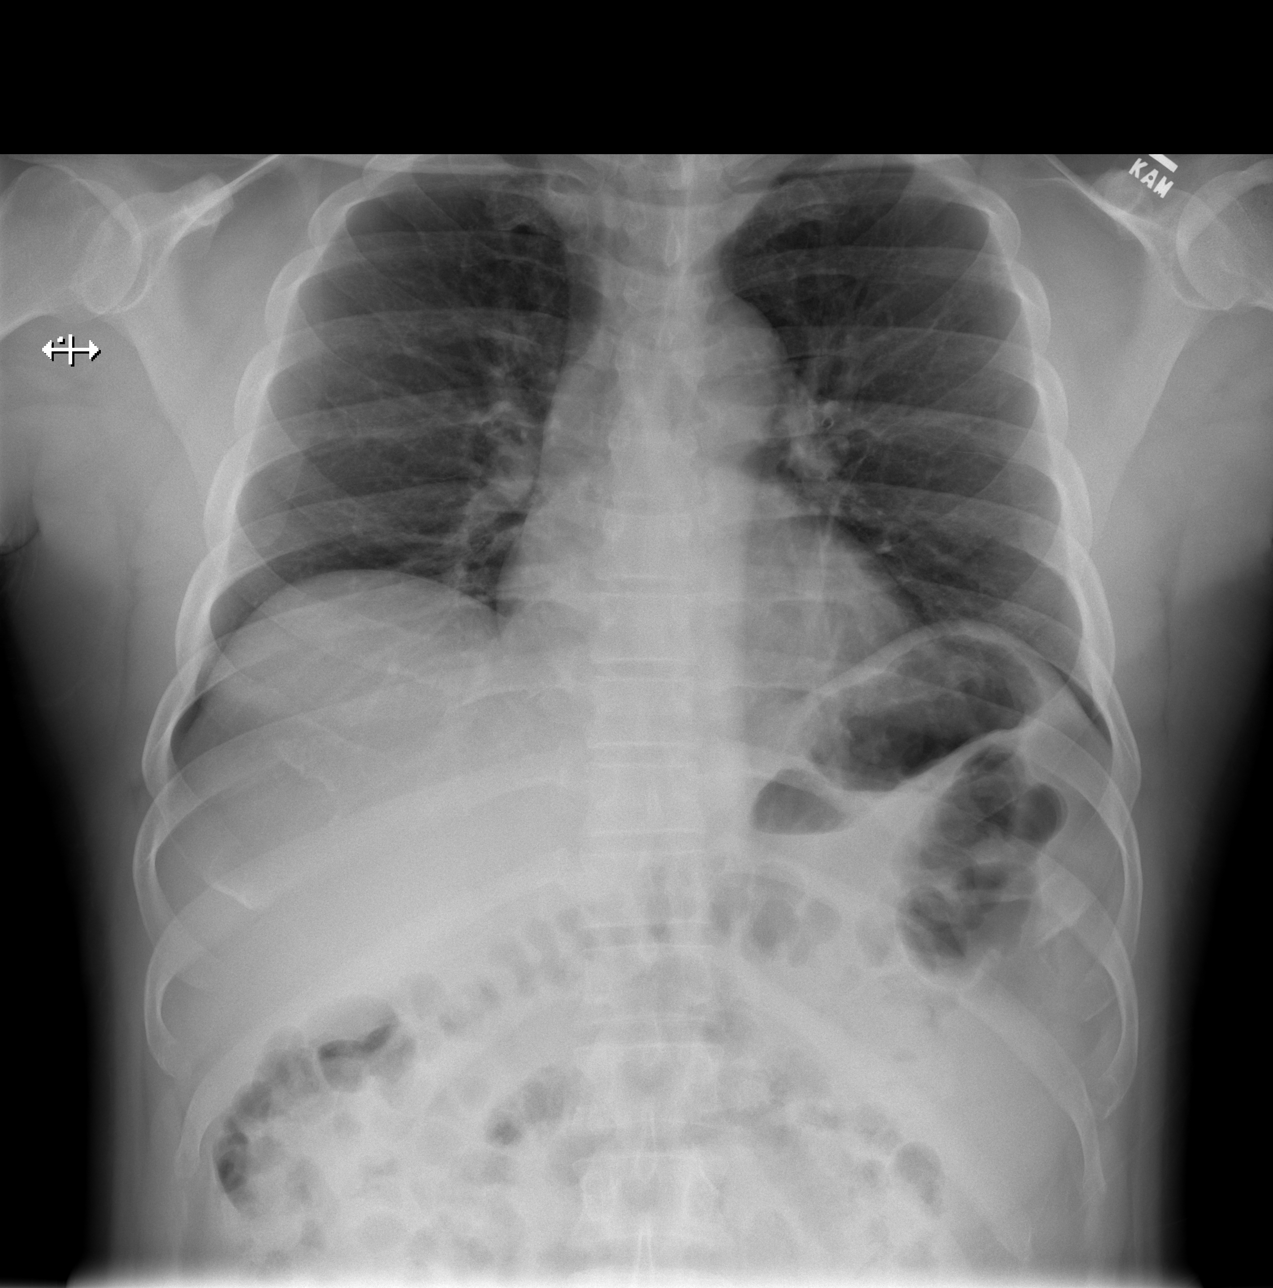

[w chest lat]
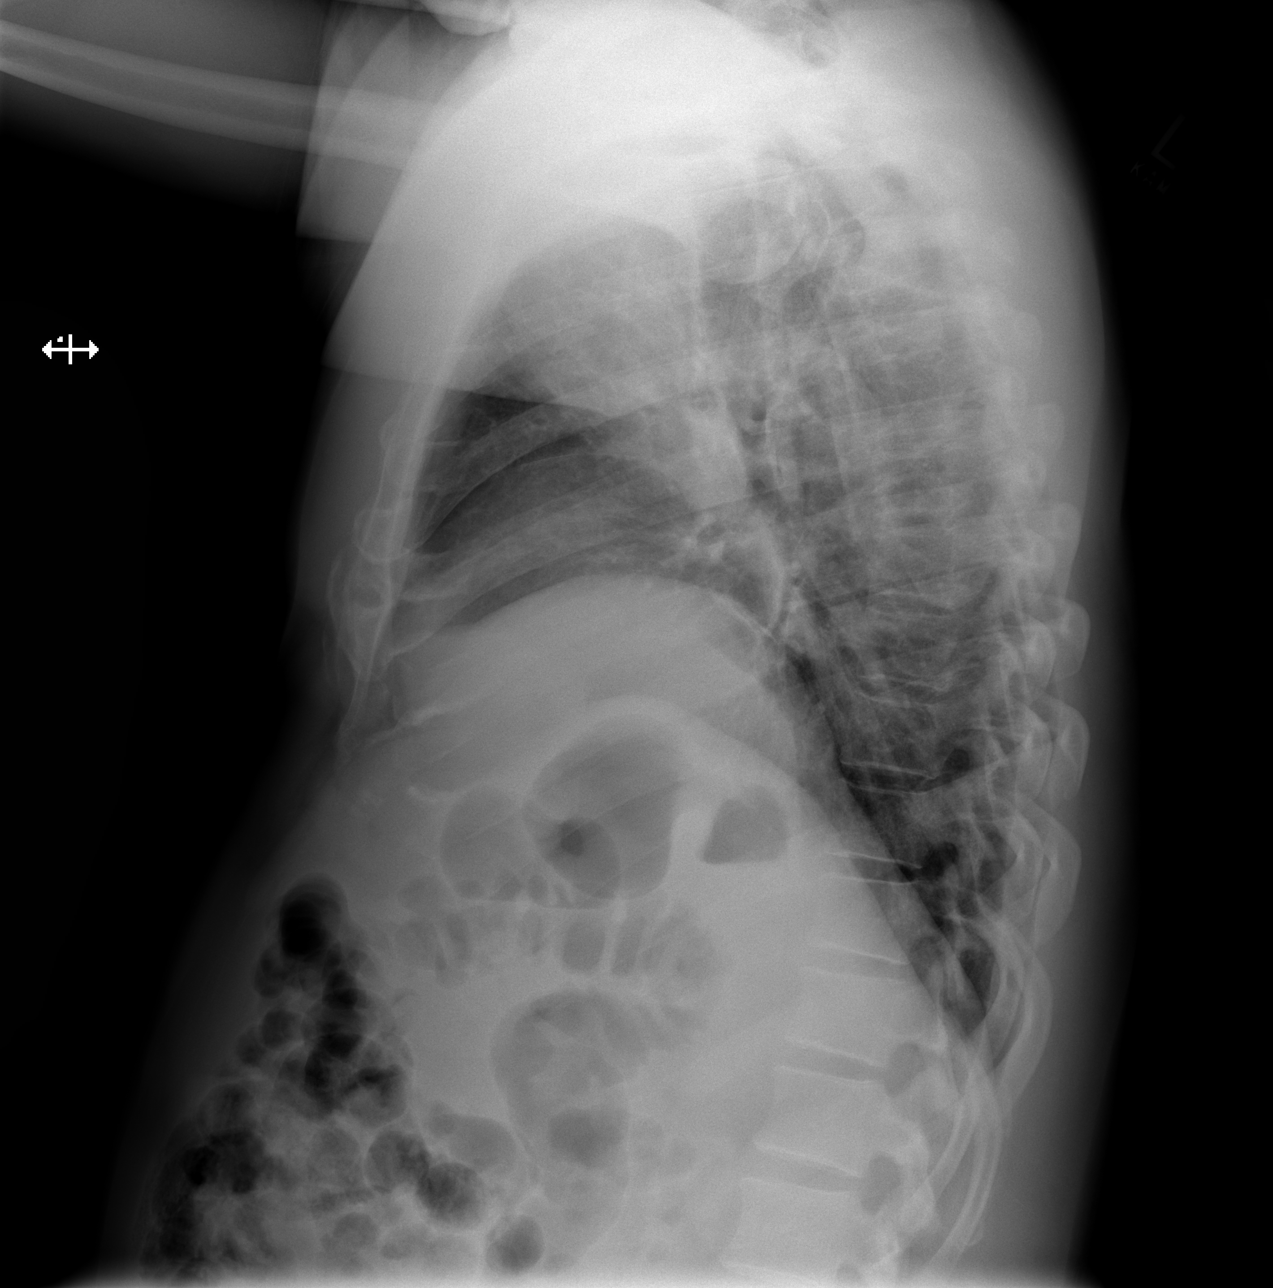

[3 of 3 positions shown; findings below may reference images not displayed]

FINDINGS: The heart size and mediastinal contours are within normal limits.
Both lungs are clear. The visualized skeletal structures are
unremarkable. Stable eventration of the right hemidiaphragm. Trachea
midline. Nonobstructive bowel gas pattern.
IMPRESSION: No active cardiopulmonary disease.
